# Patient Record
Sex: Female | Born: 1935 | Race: White | Hispanic: No | Marital: Married | Smoking: Former smoker
Health system: Southern US, Community
[De-identification: ages and names within clinical notes are randomized; demographics above are authoritative.]

## PROBLEM LIST (undated history)

## (undated) DIAGNOSIS — Z950 Presence of cardiac pacemaker: Secondary | ICD-10-CM

## (undated) DIAGNOSIS — M1990 Unspecified osteoarthritis, unspecified site: Secondary | ICD-10-CM

## (undated) DIAGNOSIS — C50919 Malignant neoplasm of unspecified site of unspecified female breast: Secondary | ICD-10-CM

## (undated) HISTORY — DX: Malignant neoplasm of unspecified site of unspecified female breast: C50.919

## (undated) HISTORY — DX: Presence of cardiac pacemaker: Z95.0

## (undated) HISTORY — PX: BREAST BIOPSY: SHX20

## (undated) HISTORY — PX: PACEMAKER IMPLANT: EP1218

## (undated) HISTORY — PX: COLONOSCOPY: SHX174

---

## 2005-08-10 ENCOUNTER — Ambulatory Visit: Payer: Self-pay

## 2005-08-12 ENCOUNTER — Ambulatory Visit: Payer: Self-pay

## 2006-01-25 ENCOUNTER — Emergency Department: Payer: Self-pay | Admitting: Emergency Medicine

## 2006-02-08 ENCOUNTER — Ambulatory Visit: Payer: Self-pay | Admitting: General Surgery

## 2006-08-09 ENCOUNTER — Ambulatory Visit: Payer: Self-pay | Admitting: General Surgery

## 2007-08-11 ENCOUNTER — Ambulatory Visit: Payer: Self-pay | Admitting: General Surgery

## 2008-02-07 ENCOUNTER — Ambulatory Visit: Payer: Self-pay | Admitting: Cardiology

## 2008-02-07 ENCOUNTER — Other Ambulatory Visit: Payer: Self-pay

## 2008-02-08 ENCOUNTER — Other Ambulatory Visit: Payer: Self-pay

## 2008-08-14 ENCOUNTER — Ambulatory Visit: Payer: Self-pay | Admitting: General Surgery

## 2009-08-19 ENCOUNTER — Ambulatory Visit: Payer: Self-pay | Admitting: General Surgery

## 2010-08-25 ENCOUNTER — Ambulatory Visit: Payer: Self-pay | Admitting: Family Medicine

## 2011-09-25 ENCOUNTER — Ambulatory Visit: Payer: Self-pay | Admitting: Family Medicine

## 2014-09-20 DIAGNOSIS — I442 Atrioventricular block, complete: Secondary | ICD-10-CM | POA: Insufficient documentation

## 2014-09-20 DIAGNOSIS — I10 Essential (primary) hypertension: Secondary | ICD-10-CM | POA: Insufficient documentation

## 2015-03-21 DIAGNOSIS — F411 Generalized anxiety disorder: Secondary | ICD-10-CM

## 2015-03-21 DIAGNOSIS — F418 Other specified anxiety disorders: Secondary | ICD-10-CM | POA: Insufficient documentation

## 2016-04-20 ENCOUNTER — Other Ambulatory Visit: Payer: Self-pay | Admitting: Family Medicine

## 2016-04-20 DIAGNOSIS — N6312 Unspecified lump in the right breast, upper inner quadrant: Secondary | ICD-10-CM

## 2016-04-28 ENCOUNTER — Ambulatory Visit
Admission: RE | Admit: 2016-04-28 | Discharge: 2016-04-28 | Disposition: A | Discharge status: Home / Self Care | Payer: Medicare Other | Source: Ambulatory Visit | Attending: Family Medicine | Admitting: Family Medicine

## 2016-04-28 DIAGNOSIS — N6312 Unspecified lump in the right breast, upper inner quadrant: Secondary | ICD-10-CM

## 2016-04-28 DIAGNOSIS — N63 Unspecified lump in breast: Secondary | ICD-10-CM | POA: Diagnosis not present

## 2016-04-29 ENCOUNTER — Other Ambulatory Visit: Payer: Self-pay | Admitting: Family Medicine

## 2016-04-29 DIAGNOSIS — N6310 Unspecified lump in the right breast, unspecified quadrant: Secondary | ICD-10-CM

## 2016-05-13 ENCOUNTER — Ambulatory Visit
Admission: RE | Admit: 2016-05-13 | Discharge: 2016-05-13 | Disposition: A | Discharge status: Home / Self Care | Payer: Medicare Other | Source: Ambulatory Visit | Attending: Family Medicine | Admitting: Family Medicine

## 2016-05-13 DIAGNOSIS — N6310 Unspecified lump in the right breast, unspecified quadrant: Secondary | ICD-10-CM

## 2016-05-13 DIAGNOSIS — N63 Unspecified lump in breast: Secondary | ICD-10-CM | POA: Insufficient documentation

## 2016-05-13 DIAGNOSIS — C50911 Malignant neoplasm of unspecified site of right female breast: Secondary | ICD-10-CM | POA: Insufficient documentation

## 2016-05-13 DIAGNOSIS — C50919 Malignant neoplasm of unspecified site of unspecified female breast: Secondary | ICD-10-CM

## 2016-05-13 HISTORY — DX: Malignant neoplasm of unspecified site of unspecified female breast: C50.919

## 2016-05-13 HISTORY — PX: BREAST BIOPSY: SHX20

## 2016-05-20 ENCOUNTER — Encounter: Payer: Self-pay | Admitting: *Deleted

## 2016-05-20 NOTE — Progress Notes (Signed)
  Oncology Nurse Navigator Documentation  Navigator Location: [facility] (05/20/16 1300) Navigator Encounter Type: Introductory phone call (05/20/16 1300)   Abnormal Finding Date: 04/28/16 (05/20/16 1300) Confirmed Diagnosis Date: 05/18/16 (05/20/16 1300)         Barriers/Navigation Needs: Coordination of Care (05/20/16 1300)   Interventions: Coordination of Care (05/20/16 1300)                      Time Spent with Patient: 45 (05/20/16 1300)   Called patient today to establish navigation services and schedule appointment with medical oncology.  Patient informed of her appointment with Dr. Grayland Ormond on 05/26/16 @ 10:00.  Patient does not want any educational literature until her appointment.  Informed Dr. Reuel Boom office of her appointment.  She is to call if she has any questions or needs.

## 2016-05-26 ENCOUNTER — Encounter: Payer: Self-pay | Admitting: Oncology

## 2016-05-26 ENCOUNTER — Encounter: Payer: Self-pay | Admitting: *Deleted

## 2016-05-26 ENCOUNTER — Inpatient Hospital Stay: Payer: Medicare Other | Attending: Oncology | Admitting: Oncology

## 2016-05-26 VITALS — BP 152/77 | HR 69 | Temp 97.1°F | Resp 18 | Ht 60.24 in | Wt 130.1 lb

## 2016-05-26 DIAGNOSIS — Z79899 Other long term (current) drug therapy: Secondary | ICD-10-CM

## 2016-05-26 DIAGNOSIS — C50211 Malignant neoplasm of upper-inner quadrant of right female breast: Secondary | ICD-10-CM | POA: Diagnosis present

## 2016-05-26 DIAGNOSIS — Z170 Estrogen receptor positive status [ER+]: Secondary | ICD-10-CM

## 2016-05-26 DIAGNOSIS — C50911 Malignant neoplasm of unspecified site of right female breast: Secondary | ICD-10-CM

## 2016-05-26 DIAGNOSIS — Z79811 Long term (current) use of aromatase inhibitors: Secondary | ICD-10-CM | POA: Diagnosis not present

## 2016-05-26 LAB — SURGICAL PATHOLOGY

## 2016-05-26 MED ORDER — LETROZOLE 2.5 MG PO TABS: 2.5000 mg | ORAL_TABLET | Freq: Every day | ORAL | Status: DC

## 2016-05-26 NOTE — Progress Notes (Signed)
  Oncology Nurse Navigator Documentation  Navigator Location: [facility] (05/26/16 1100) Navigator Encounter Type: Initial MedOnc (05/26/16 1100)         Treatment Initiated Date: 05/26/16 (05/26/16 1100) Patient Visit Type: MedOnc (05/26/16 1100) Treatment Phase: Pre-Tx/Tx Discussion (05/26/16 1100) Barriers/Navigation Needs: Education (05/26/16 1100) Education: Newly Diagnosed Cancer Education;Coping with Diagnosis/ Prognosis (05/26/16 1100) Interventions: Education Method (05/26/16 1100)     Education Method: Written;Verbal (05/26/16 1100)      Acuity: Level 2 (05/26/16 1100)         Time Spent with Patient: 90 (05/26/16 1100)   Met patient and her sons today during her initial medical oncology visit with Dr. Grayland Ormond.  Patient is newly diagnosed with ER/PR positive, Her2 2+ breast cancer.  Dr. Grayland Ormond discussed options of treatment and patient wishes to start antihormonal therapy as neoadjuvant treatment.  Gave patient breast cancer educational literature, "My Breast Cancer Treatment Handbook" by Josephine Igo, RN.  She is to call if she has any questions or needs.

## 2016-05-26 NOTE — Progress Notes (Signed)
Offers no complaints  

## 2016-05-27 DIAGNOSIS — C50911 Malignant neoplasm of unspecified site of right female breast: Secondary | ICD-10-CM | POA: Insufficient documentation

## 2016-05-28 ENCOUNTER — Encounter: Payer: Self-pay | Admitting: Anatomic Pathology & Clinical Pathology

## 2016-06-02 DIAGNOSIS — C50211 Malignant neoplasm of upper-inner quadrant of right female breast: Secondary | ICD-10-CM | POA: Insufficient documentation

## 2016-06-02 NOTE — Progress Notes (Signed)
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   INTERVAL HISTORY: Patient is a 82 year old female who was noted to have a palpable right breast mass on examination. Subsequent mammogram and biopsy confirm the above stated breast cancer. Currently, she feels well and is at her baseline. She has no neurologic complaints. She denies any pain. She has good appetite and denies weight loss. She has no recent fevers or illnesses. She denies any chest pain or shortness of breath. She has no nausea, vomiting, constipation, or diarrhea. She has no urinary complaints. Patient feels at her baseline and offers no further specific complaints today.  REVIEW OF SYSTEMS:   Review of Systems  Constitutional: Negative.  Negative for fever, malaise/fatigue and weight loss.  Respiratory: Negative.  Negative for cough.   Cardiovascular: Negative.  Negative for chest pain.  Gastrointestinal: Negative.  Negative for abdominal pain.  Genitourinary: Negative.   Musculoskeletal: Negative.   Neurological: Negative.  Negative for weakness.  Psychiatric/Behavioral: Negative.     As per HPI. Otherwise, a complete review of systems is negatve.  PAST MEDICAL HISTORY: Past Medical History:  Diagnosis Date  . Pacemaker     PAST SURGICAL HISTORY: Past Surgical History:  Procedure Laterality Date  . BREAST BIOPSY Right    neg  . BREAST BIOPSY Right 05/13/2016   path pending    FAMILY HISTORY: Family History  Problem Relation Age of Onset  . Breast cancer Neg Hx        ADVANCED DIRECTIVES:    HEALTH MAINTENANCE: Social History  Substance Use Topics  . Smoking status: Never Smoker  . Smokeless tobacco: Not on file  .  Alcohol use Not on file     Colonoscopy:  PAP:  Bone density:  Lipid panel:  Allergies no known allergies  Current Outpatient Prescriptions  Medication Sig Dispense Refill  . ALPRAZolam (XANAX) 0.25 MG tablet Take 1 tablet by mouth at bedtime.    Marland Kitchen aspirin EC 81 MG tablet Take 1 tablet by mouth daily.    . B Complex Vitamins (VITAMIN B COMPLEX PO) Take 1 tablet by mouth daily.    . Cholecalciferol (VITAMIN D3) 2000 units capsule Take 1 capsule by mouth daily.    . Coenzyme Q10 (COQ-10 PO) Take 1 tablet by mouth daily.    . Magnesium 250 MG TABS Take 1 tablet by mouth daily.    . Omega-3 Fatty Acids (FISH OIL) 1200 MG CAPS Take 1 tablet by mouth 2 (two) times daily.    Marland Kitchen letrozole (FEMARA) 2.5 MG tablet Take 1 tablet (2.5 mg total) by mouth daily. 30 tablet 3   No current facility-administered medications for this visit.     OBJECTIVE: Vitals:   05/26/16 1013  BP: (!) 152/77  Pulse: 69  Resp: 18  Temp: 97.1 F (36.2 C)     Body mass index is 25.2 kg/m.    ECOG FS:0 - Asymptomatic  General: Well-developed, well-nourished, no acute distress. Eyes: Pink conjunctiva, anicteric sclera. HEENT: Normocephalic, moist mucous membranes, clear oropharnyx. Breasts: Easily palpable 2 cm right breast mass. Lungs: Clear to auscultation bilaterally. Heart: Regular rate and rhythm. No rubs, murmurs, or gallops. Abdomen: Soft, nontender, nondistended. No organomegaly noted, normoactive bowel sounds. Musculoskeletal: No edema, cyanosis,  or clubbing. Neuro: Alert, answering all questions appropriately. Cranial nerves grossly intact. Skin: No rashes or petechiae noted. Psych: Normal affect. Lymphatics: No cervical, calvicular, axillary or inguinal LAD.   LAB RESULTS:  No results found for: NA, K, CL, CO2, GLUCOSE, BUN, CREATININE, CALCIUM, PROT, ALBUMIN, AST, ALT, ALKPHOS, BILITOT, GFRNONAA, GFRAA  No results found for: WBC, NEUTROABS, HGB, HCT, MCV, PLT   STUDIES: Mm Clip Placement  Right  Result Date: 05/13/2016 CLINICAL DATA:  Status post ultrasound-guided right breast biopsy EXAM: DIAGNOSTIC RIGHT MAMMOGRAM POST ULTRASOUND BIOPSY COMPARISON:  Previous exam(s). FINDINGS: Mammographic images were obtained following ultrasound guided biopsy of a suspicious right breast mass at 2 o'clock. Post biopsy mammogram demonstrates the ribbon shaped biopsy marker in the expected location within the upper, inner right breast. IMPRESSION: Appropriate marker position as above. Final Assessment: Post Procedure Mammograms for Marker Placement Electronically Signed   By: Pamelia Hoit M.D.   On: 05/13/2016 14:00   [place] Rt Breast Bx W Loc Dev 1st Lesion Img Bx Spec US Guide  Addendum Date: 05/21/2016   ADDENDUM REPORT: 05/21/2016 12:51 ADDENDUM: Pathology of the right breast biopsy revealed BREAST, RIGHT, 2 O'CLOCK, 7 CM FROM NIPPLE; ULTRASOUND-GUIDED CORE BIOPSY: INVASIVE MAMMARY CARCINOMA OF NO SPECIAL TYPE. Size of invasive carcinoma: 1.3 cm in this sample. Histologic grade of invasive carcinoma: Grade 2. Ductal carcinoma in situ: Not identified. Lymphovascular invasion: Not identified. Comment: The tumor exhibits diffusely infiltrative features. Immunohistochemistry for E-cadherin was performed for further classification, and the neoplastic cells are diffusely and strongly positive for E-cadherin, which supports the above diagnosis and is evidence against a lobular carcinoma variant. See below for ER, PR, and HER2 IHC results. HER2 FISH has been ordered for 2+ HER IHC and the result will be reported in an addendum. The diagnosis was called Deneice Breedlove in the [facility] on 05/18/16 at 4:20 PM. Read-back was performed. This was found to be concordant with Dr. Raul Del impression and notes. Recommendations: Oncology and surgical referral as clinically appropriate. The patient was contacted by phone on 05/15/16 by Jetta Lout, [facility]) for a post biopsy check. The patient  stated she had done well with no bleeding. She does have a bruise. Post biopsy instructions were reviewed with the patient. All of her questions were answered. She was informed that the results were delayed for further testing and would not be available before 05/18/16. Dr. Reuel Boom nurse was also notified of the pathology delay. At the request of the patient, results and recommendations were relayed to the patient by phone by Dr. Jimmye Norman on 05/19/16. An appointment was made for the patient to see Dr. Grayland Ormond in oncology on 05/26/16 at 10:00 AM. Surgical referral will be made by Dr. Grayland Ormond if appropriate after her consultation on 05/26/16. The patient and her son, Jermisha Hoffart were both notified of this appointment. Addendum by Jetta Lout, RRA on 05/21/16. Electronically Signed   By: Pamelia Hoit M.D.   On: 05/21/2016 12:51  Result Date: 05/21/2016 CLINICAL DATA:  82 year old female with a suspicious right breast mass at 2 o'clock EXAM: ULTRASOUND GUIDED RIGHT BREAST CORE NEEDLE BIOPSY COMPARISON:  Previous exam(s). FINDINGS: I met with the patient and we discussed the procedure of ultrasound-guided biopsy, including benefits and alternatives. We discussed the high likelihood of a successful procedure. We discussed the risks of the procedure, including infection, bleeding, tissue injury, clip migration, and inadequate sampling. Informed written consent was given. The usual time-out protocol was performed immediately prior to the  procedure. Using sterile technique and 1% Lidocaine as local anesthetic, under direct ultrasound visualization, a 12 gauge spring-loaded device was used to perform biopsy of a suspicious right breast mass at 2 o'clock, 7 cm from the nipple using a lateral to medial approach. At the conclusion of the procedure a ribbon shaped tissue marker clip was deployed into the biopsy cavity. Follow up 2 view mammogram was performed and dictated separately. IMPRESSION: Ultrasound guided biopsy of a  suspicious right breast mass at 2 o'clock. No apparent complications. Electronically Signed: By: Pamelia Hoit M.D. 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   PLAN:    1.  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 This may function as neoadjuvant treatment as patient said she might reconsider surgery in the future. Return to clinic in 3 months for further evaluation. 2. Post menopausal: Will get baseline bone mineral density in the next 1-2 weeks since patient is starting letrozole.  Approximately 45 minutes was spent in discussion of which greater than 50% was consultation.  Patient expressed understanding and was in agreement with this plan. She also understands that She can call clinic at any time with any questions, concerns, or complaints.   Primary malignant neoplasm of upper inner quadrant of right female breast [facility])   Staging form: Breast, AJCC 7th Edition   - Clinical stage from 06/02/2016: Stage IIA (T2, N0, M0) - Signed by Lloyd Huger, MD on 06/02/2016  Lloyd Huger, MD   06/02/2016 10:49 PM

## 2016-06-17 ENCOUNTER — Ambulatory Visit
Admission: RE | Admit: 2016-06-17 | Discharge: 2016-06-17 | Disposition: A | Discharge status: Home / Self Care | Payer: Medicare Other | Source: Ambulatory Visit | Attending: Oncology | Admitting: Oncology

## 2016-06-17 DIAGNOSIS — Z780 Asymptomatic menopausal state: Secondary | ICD-10-CM | POA: Diagnosis not present

## 2016-06-17 DIAGNOSIS — C50911 Malignant neoplasm of unspecified site of right female breast: Secondary | ICD-10-CM | POA: Diagnosis not present

## 2016-06-17 DIAGNOSIS — M810 Age-related osteoporosis without current pathological fracture: Secondary | ICD-10-CM | POA: Diagnosis not present

## 2016-06-21 ENCOUNTER — Other Ambulatory Visit: Payer: Self-pay | Admitting: Oncology

## 2016-06-21 MED ORDER — ALENDRONATE SODIUM 70 MG PO TABS: 70.0000 mg | ORAL_TABLET | ORAL | 3 refills | Status: DC

## 2016-08-26 ENCOUNTER — Inpatient Hospital Stay: Payer: Medicare Other | Attending: Oncology | Admitting: Oncology

## 2016-08-26 VITALS — BP 183/81 | HR 97 | Temp 97.9°F | Resp 18 | Wt 128.5 lb

## 2016-08-26 DIAGNOSIS — I10 Essential (primary) hypertension: Secondary | ICD-10-CM | POA: Insufficient documentation

## 2016-08-26 DIAGNOSIS — Z7982 Long term (current) use of aspirin: Secondary | ICD-10-CM

## 2016-08-26 DIAGNOSIS — Z79811 Long term (current) use of aromatase inhibitors: Secondary | ICD-10-CM

## 2016-08-26 DIAGNOSIS — Z170 Estrogen receptor positive status [ER+]: Secondary | ICD-10-CM | POA: Diagnosis not present

## 2016-08-26 DIAGNOSIS — C50211 Malignant neoplasm of upper-inner quadrant of right female breast: Secondary | ICD-10-CM | POA: Insufficient documentation

## 2016-08-26 DIAGNOSIS — M818 Other osteoporosis without current pathological fracture: Secondary | ICD-10-CM | POA: Diagnosis not present

## 2016-08-26 DIAGNOSIS — Z79899 Other long term (current) drug therapy: Secondary | ICD-10-CM | POA: Diagnosis not present

## 2016-08-26 NOTE — Progress Notes (Signed)
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   INTERVAL HISTORY: Patient returns to clinic for routine 3 month evaluation. She is near complete resolution of her right breast mass. She is tolerating letrozole well without significant side effects. Currently, she feels well and is at her baseline. She has no neurologic complaints. She denies any pain. She has a good appetite and denies weight loss. She has no recent fevers or illnesses. She denies any chest pain or shortness of breath. She has no nausea, vomiting, constipation, or diarrhea. She has no urinary complaints. Patient feels at her baseline and offers no specific complaints today.  REVIEW OF SYSTEMS:   Review of Systems  Constitutional: Negative.  Negative for fever, malaise/fatigue and weight loss.  Respiratory: Negative.  Negative for cough.   Cardiovascular: Negative.  Negative for chest pain.  Gastrointestinal: Negative.  Negative for abdominal pain.  Genitourinary: Negative.   Musculoskeletal: Negative.   Neurological: Negative.  Negative for weakness.  Psychiatric/Behavioral: Negative.     As per HPI. Otherwise, a complete review of systems is negative.  PAST MEDICAL HISTORY: Past Medical History:  Diagnosis Date  . Pacemaker     PAST SURGICAL HISTORY: Past Surgical History:  Procedure Laterality Date  . BREAST BIOPSY Right    neg  . BREAST BIOPSY Right 05/13/2016   path pending    FAMILY HISTORY: Family History  Problem Relation Age of Onset  . Breast cancer Neg Hx        ADVANCED DIRECTIVES:    HEALTH MAINTENANCE: Social History  Substance Use Topics  . Smoking status: Never Smoker  . Smokeless tobacco: Not on  file  . Alcohol use Not on file     Colonoscopy:  PAP:  Bone density:  Lipid panel:  Allergies no known allergies  Current Outpatient Prescriptions  Medication Sig Dispense Refill  . alendronate (FOSAMAX) 70 MG tablet Take 1 tablet (70 mg total) by mouth once a week. Take with a full glass of water on an empty stomach. 12 tablet 3  . ALPRAZolam (XANAX) 0.25 MG tablet Take 1 tablet by mouth at bedtime.    Marland Kitchen aspirin EC 81 MG tablet Take 1 tablet by mouth daily.    . B Complex Vitamins (VITAMIN B COMPLEX PO) Take 1 tablet by mouth daily.    . Cholecalciferol (VITAMIN D3) 2000 units capsule Take 1 capsule by mouth daily.    . Coenzyme Q10 (COQ-10 PO) Take 1 tablet by mouth daily.    Marland Kitchen letrozole (FEMARA) 2.5 MG tablet Take 1 tablet (2.5 mg total) by mouth daily. 30 tablet 3  . Magnesium 250 MG TABS Take 1 tablet by mouth daily.    . Omega-3 Fatty Acids (FISH OIL) 1200 MG CAPS Take 1 tablet by mouth 2 (two) times daily.     No current facility-administered medications for this visit.     OBJECTIVE: Vitals:   08/26/16 1100  BP: (!) 183/81  Pulse: 97  Resp: 18  Temp: 97.9 F (36.6 C)     Body mass index is 24.91 kg/m.    ECOG FS:0 - Asymptomatic  General: Well-developed, well-nourished, no acute distress. Eyes: Pink conjunctiva, anicteric sclera. Breasts: Right breast mass no longer palpable.  Lungs: Clear to  auscultation bilaterally. Heart: Regular rate and rhythm. No rubs, murmurs, or gallops. Abdomen: Soft, nontender, nondistended. No organomegaly noted, normoactive bowel sounds. Musculoskeletal: No edema, cyanosis, or clubbing. Neuro: Alert, answering all questions appropriately. Cranial nerves grossly intact. Skin: No rashes or petechiae noted. Psych: Normal affect.   LAB RESULTS:  No results found for: NA, K, CL, CO2, GLUCOSE, BUN, CREATININE, CALCIUM, PROT, ALBUMIN, AST, ALT, ALKPHOS, BILITOT, GFRNONAA, GFRAA  No results found for: WBC, NEUTROABS, HGB, HCT, MCV,  PLT   STUDIES: No results found.  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   PLAN:    1.  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 Patient initiated letrozole in July 2017 with near resolution of her breast mass. Return to clinic in 4 months for further evaluation. 2. Osteoporosis: Baseline bone mineral density on June 17, 2016 reported a T score of -2.9. Continue Fosamax, calcium and vitamin D. Repeat in August 2018.  3. Hypertension: Patient's blood pressure is significantly elevated today. Continue monitoring and treatment per primary care.  Patient expressed understanding and was in agreement with this plan. She also understands that She can call clinic at any time with any questions, concerns, or complaints.   Primary malignant neoplasm of upper inner quadrant of right female breast [facility])   Staging form: Breast, AJCC 7th Edition   - Clinical stage from 06/02/2016: Stage IIA (T2, N0, M0) - Signed by Lloyd Huger, MD on 06/02/2016  Lloyd Huger, MD   08/29/2016 8:32 AM

## 2016-08-26 NOTE — Progress Notes (Signed)
States is having left hip pain that started recently.

## 2016-09-07 ENCOUNTER — Other Ambulatory Visit: Payer: Self-pay | Admitting: *Deleted

## 2016-09-07 MED ORDER — LETROZOLE 2.5 MG PO TABS: 2.5000 mg | ORAL_TABLET | Freq: Every day | ORAL | 3 refills | Status: DC

## 2016-12-11 ENCOUNTER — Other Ambulatory Visit: Payer: Self-pay | Admitting: *Deleted

## 2016-12-11 MED ORDER — LETROZOLE 2.5 MG PO TABS: 2.5000 mg | ORAL_TABLET | Freq: Every day | ORAL | 1 refills | Status: DC

## 2016-12-27 NOTE — Progress Notes (Signed)
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   INTERVAL HISTORY: Patient returns to clinic for routine evaluation. She is tolerating letrozole well without significant side effects. Currently, she feels well and is at her baseline. She has no neurologic complaints. She denies any pain. She has a good appetite and denies weight loss. She has no recent fevers or illnesses. She denies any chest pain or shortness of breath. She has no nausea, vomiting, constipation, or diarrhea. She has no urinary complaints. Patient feels at her baseline and offers no specific complaints today.  REVIEW OF SYSTEMS:   Review of Systems  Constitutional: Negative.  Negative for fever, malaise/fatigue and weight loss.  Respiratory: Negative.  Negative for cough and shortness of breath.   Cardiovascular: Negative.  Negative for chest pain and leg swelling.  Gastrointestinal: Negative.  Negative for abdominal pain.  Genitourinary: Negative.   Musculoskeletal: Negative.   Neurological: Negative.  Negative for sensory change and weakness.  Psychiatric/Behavioral: Negative.  The patient is not nervous/anxious.     As per HPI. Otherwise, a complete review of systems is negative.  PAST MEDICAL HISTORY: Past Medical History:  Diagnosis Date  . Breast cancer ([place])   . Pacemaker     PAST SURGICAL HISTORY: Past Surgical History:  Procedure Laterality Date  . BREAST BIOPSY Right    neg  . BREAST BIOPSY Right 05/13/2016   path pending    FAMILY HISTORY: Family History  Problem Relation Age of Onset  . Breast cancer Neg Hx        ADVANCED DIRECTIVES:    HEALTH MAINTENANCE: Social History  Substance Use Topics  .  Smoking status: Never Smoker  . Smokeless tobacco: Never Used  . Alcohol use No     Colonoscopy:  PAP:  Bone density:  Lipid panel:  No Known Allergies  Current Outpatient Prescriptions  Medication Sig Dispense Refill  . ALPRAZolam (XANAX) 0.25 MG tablet Take 1 tablet by mouth at bedtime.    Marland Kitchen aspirin EC 81 MG tablet Take 1 tablet by mouth daily.    . B Complex Vitamins (VITAMIN B COMPLEX PO) Take 1 tablet by mouth daily.    . Calcium Carbonate-Vitamin D (CALTRATE 600+D) 600-400 MG-UNIT tablet Take 1 tablet by mouth 2 (two) times daily.    . Cholecalciferol (VITAMIN D3) 2000 units capsule Take 1 capsule by mouth daily.    . Coenzyme Q10 (COQ-10 PO) Take 1 tablet by mouth daily.    Marland Kitchen letrozole (FEMARA) 2.5 MG tablet Take 1 tablet (2.5 mg total) by mouth daily. 90 tablet 1  . Omega-3 Fatty Acids (FISH OIL) 1200 MG CAPS Take 1 tablet by mouth 2 (two) times daily.     No current facility-administered medications for this visit.     OBJECTIVE: Vitals:   12/28/16 1152  BP: (!) 151/59  Pulse: 67  Resp: 18  Temp: 98.3 F (36.8 C)     Body mass index is 24.39 kg/m.    ECOG FS:0 - Asymptomatic  General: Well-developed, well-nourished, no acute distress. Eyes: Pink conjunctiva, anicteric sclera. Breasts: Right breast mass no longer palpable.  Lungs: Clear to auscultation bilaterally. Heart: Regular rate and rhythm. No rubs, murmurs, or gallops. Abdomen: Soft, nontender, nondistended. No organomegaly noted,  normoactive bowel sounds. Musculoskeletal: No edema, cyanosis, or clubbing. Neuro: Alert, answering all questions appropriately. Cranial nerves grossly intact. Skin: No rashes or petechiae noted. Psych: Normal affect.   LAB RESULTS:  No results found for: NA, K, CL, CO2, GLUCOSE, BUN, CREATININE, CALCIUM, PROT, ALBUMIN, AST, ALT, ALKPHOS, BILITOT, GFRNONAA, GFRAA  No results found for: WBC, NEUTROABS, HGB, HCT, MCV, PLT   STUDIES: No results found.  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   PLAN:    1.  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 Patient initiated letrozole in July 2017 with near complete resolution of her breast mass. Patient will require repeat mammogram in approximately 6 months. Return to clinic 1-2 days later for further evaluation.  2. Osteoporosis: Baseline bone mineral density on June 17, 2016 reported a T score of -2.9. Continue Fosamax, calcium and vitamin D. Repeat in August 2018.  3. Hypertension: Patient's blood pressure is elevated today. Continue monitoring and treatment per primary care.  Patient expressed understanding and was in agreement with this plan. She also understands that She can call clinic at any time with any questions, concerns, or complaints.   Cancer Staging Primary malignant neoplasm of upper inner quadrant of right female breast [facility]) Staging form: Breast, AJCC 7th Edition - Clinical stage from 06/02/2016: Stage IIA (T2, N0, M0) - Signed by Lloyd Huger, MD on 06/02/2016   Lloyd Huger, MD   12/29/2016 4:37 PM

## 2016-12-28 ENCOUNTER — Inpatient Hospital Stay: Payer: Medicare Other | Attending: Oncology | Admitting: Oncology

## 2016-12-28 ENCOUNTER — Encounter: Payer: Self-pay | Admitting: Oncology

## 2016-12-28 ENCOUNTER — Inpatient Hospital Stay: Payer: Medicare Other

## 2016-12-28 VITALS — BP 151/59 | HR 67 | Temp 98.3°F | Resp 18 | Ht 60.24 in | Wt 125.9 lb

## 2016-12-28 DIAGNOSIS — C50211 Malignant neoplasm of upper-inner quadrant of right female breast: Secondary | ICD-10-CM | POA: Diagnosis present

## 2016-12-28 DIAGNOSIS — Z170 Estrogen receptor positive status [ER+]: Secondary | ICD-10-CM | POA: Diagnosis not present

## 2016-12-28 DIAGNOSIS — M818 Other osteoporosis without current pathological fracture: Secondary | ICD-10-CM | POA: Diagnosis not present

## 2016-12-28 DIAGNOSIS — Z79899 Other long term (current) drug therapy: Secondary | ICD-10-CM | POA: Diagnosis not present

## 2016-12-28 DIAGNOSIS — I10 Essential (primary) hypertension: Secondary | ICD-10-CM | POA: Diagnosis not present

## 2016-12-28 DIAGNOSIS — Z7982 Long term (current) use of aspirin: Secondary | ICD-10-CM | POA: Diagnosis not present

## 2016-12-28 DIAGNOSIS — Z79811 Long term (current) use of aromatase inhibitors: Secondary | ICD-10-CM | POA: Insufficient documentation

## 2016-12-28 DIAGNOSIS — Z780 Asymptomatic menopausal state: Secondary | ICD-10-CM

## 2016-12-28 NOTE — Progress Notes (Signed)
Pt notes no problems with breast cancer

## 2017-02-18 IMAGING — MG US  BREAST BX W/ LOC DEV 1ST LESION IMG BX SPEC US GUIDE*R*
1 series · 8 of 8 positions shown · non-contrast
Comparison: Previous exam(s).

ADDENDUM:
Pathology of the right breast biopsy revealed BREAST, RIGHT, 2
O'CLOCK, 7 CM FROM NIPPLE; ULTRASOUND-GUIDED CORE BIOPSY: INVASIVE
MAMMARY CARCINOMA OF NO SPECIAL TYPE. Size of invasive carcinoma:
1.3 cm in this sample. Histologic grade of invasive carcinoma: Grade
2. Ductal carcinoma in situ: Not identified. Lymphovascular
invasion: Not identified.

Comment: The tumor exhibits diffusely infiltrative features.
Immunohistochemistry for E-cadherin was performed for further
classification, and the neoplastic cells are diffusely and strongly
positive for E-cadherin, which supports the above diagnosis and is
evidence against a lobular carcinoma variant. See below for ER, PR,
and HER2 IHC results. HER2 FISH has been ordered for 2+ HER IHC and
the result will be reported in an addendum. The diagnosis was called
[facility]ulgata Menoe in the [facility] on 05/18/16 at
[DATE]. Read-back was performed.
This was found to be concordant with Dr. Chito impression and
notes.
Recommendations: Oncology and surgical referral as clinically
appropriate.
The patient was contacted by phone on 05/15/16 by Janel, Gelder
([REDACTED]) for a post biopsy check. The patient stated
she had done well with no bleeding. She does have a bruise. Post
biopsy instructions were reviewed with the patient. All of her
questions were answered. She was informed that the results were
delayed for further testing and would not be available before
05/18/16. Dr. Tinho nurse was also notified of the pathology
delay.
At the request of the patient, results and recommendations were
relayed to the patient by phone by Dr. Laurence on 05/19/16. An
appointment was made for the patient to see Dr. Raptorbad in oncology
on 05/26/16 at [DATE]. Surgical referral will be made by Dr.
Raptorbad if appropriate after her consultation on 05/26/16. The
patient and her Khon, Md Sawon were both notified of this
appointment.
Addendum by Janel, Gelder on 05/21/16.
CLINICAL DATA: [AGE] female with a suspicious right breast
mass at 2 o'clock
EXAM:
ULTRASOUND GUIDED RIGHT BREAST CORE NEEDLE BIOPSY

[Series 1: MG view · 0.06mm/px · 8 of 10 slices shown]
[im 1/10]
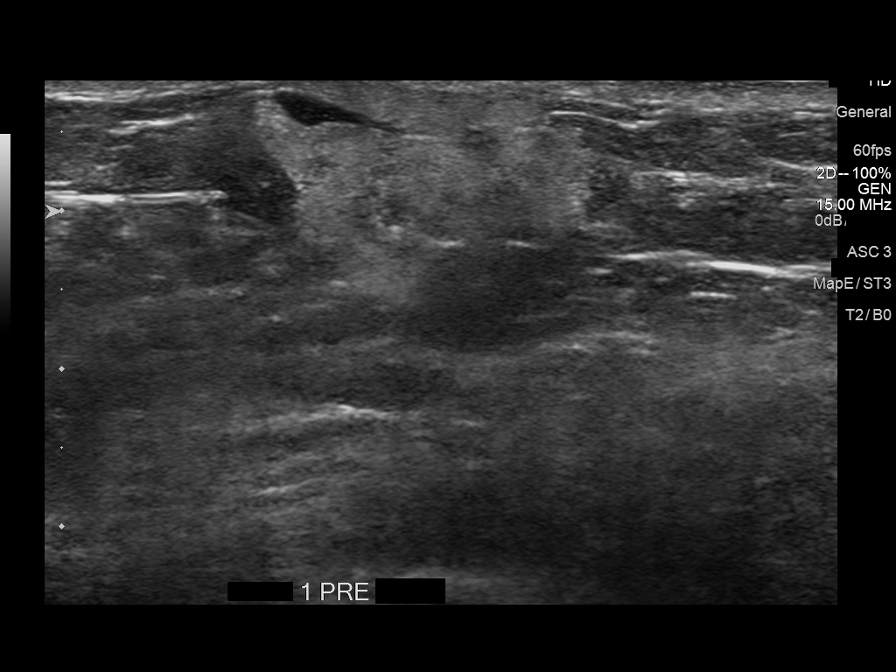
[im 2/10]
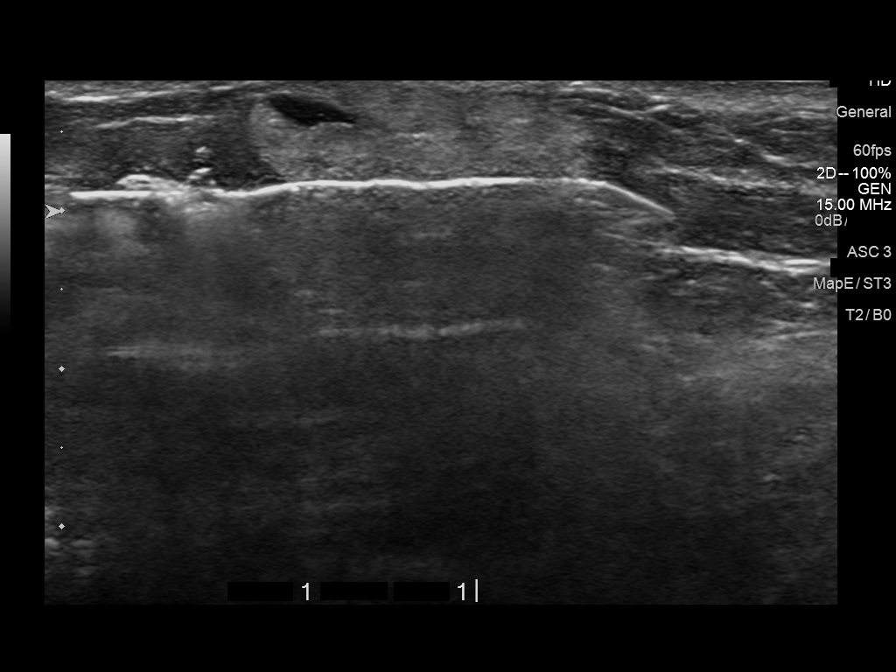
[im 3/10]
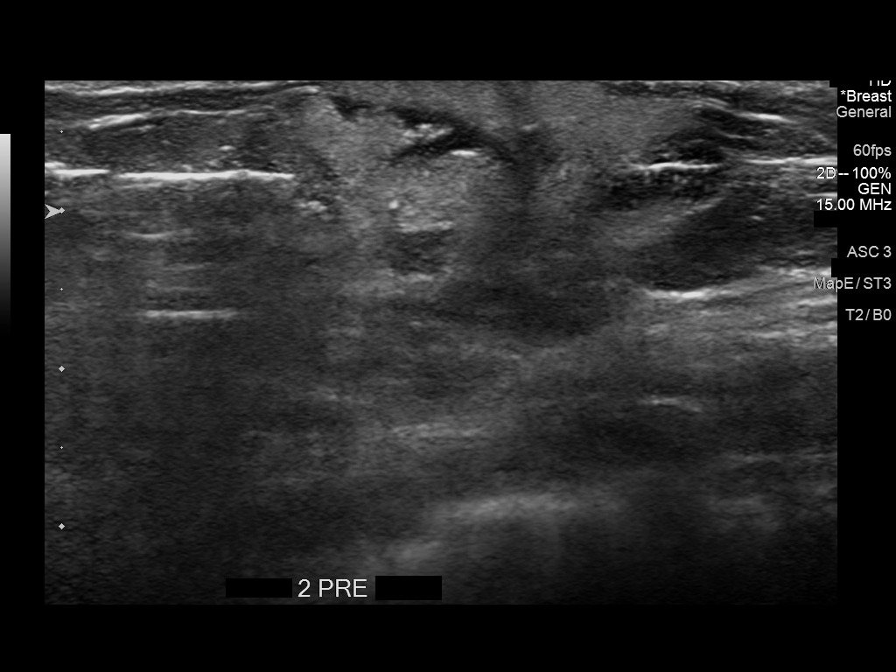
[im 4/10]
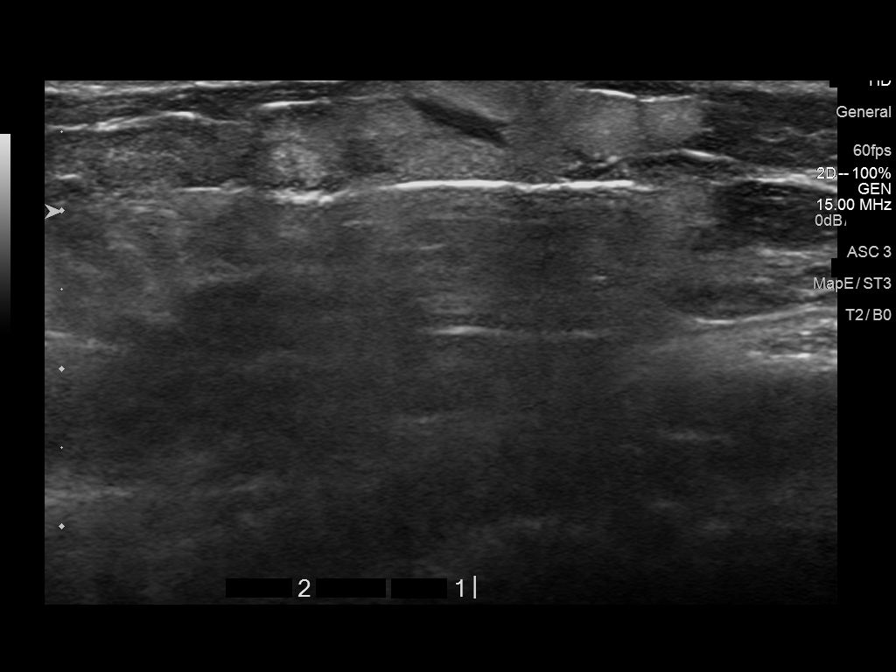
[im 6/10]
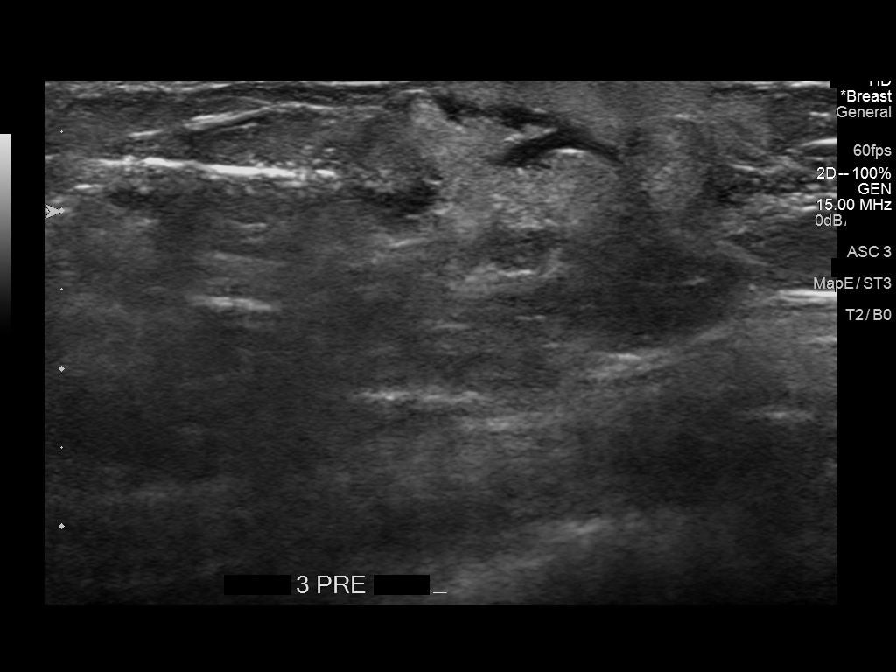
[im 7/10]
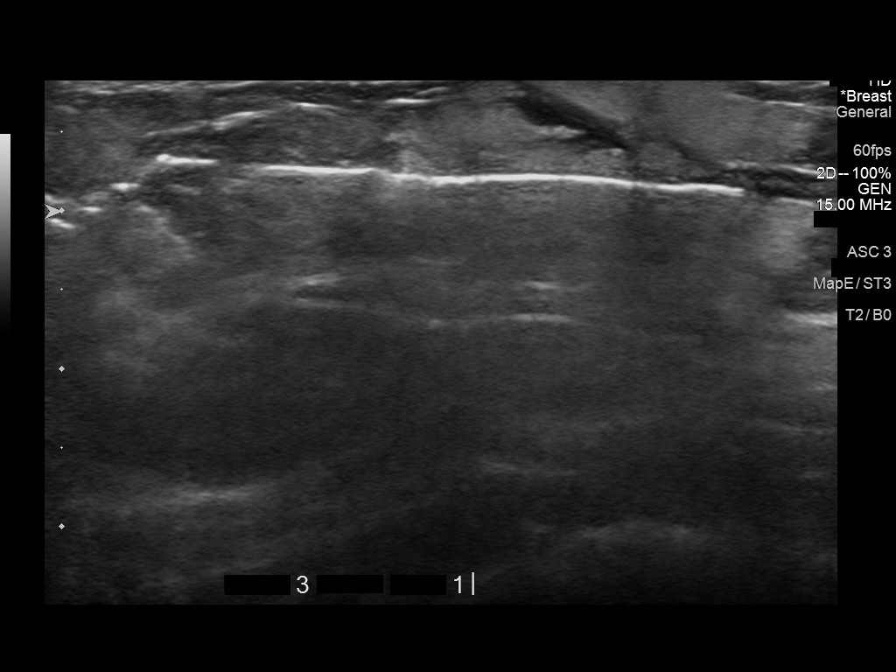
[im 8/10]
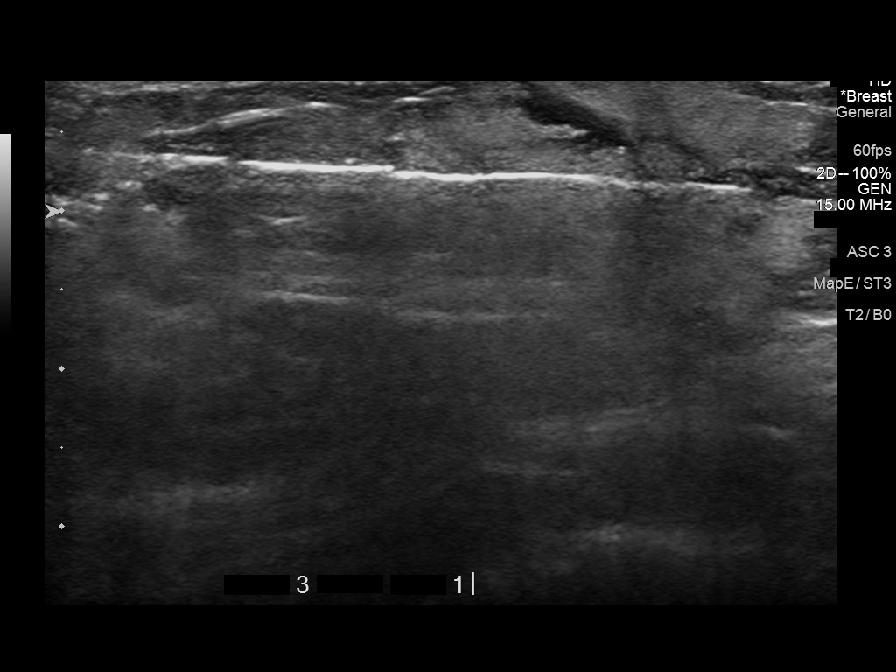
[im 10/10]
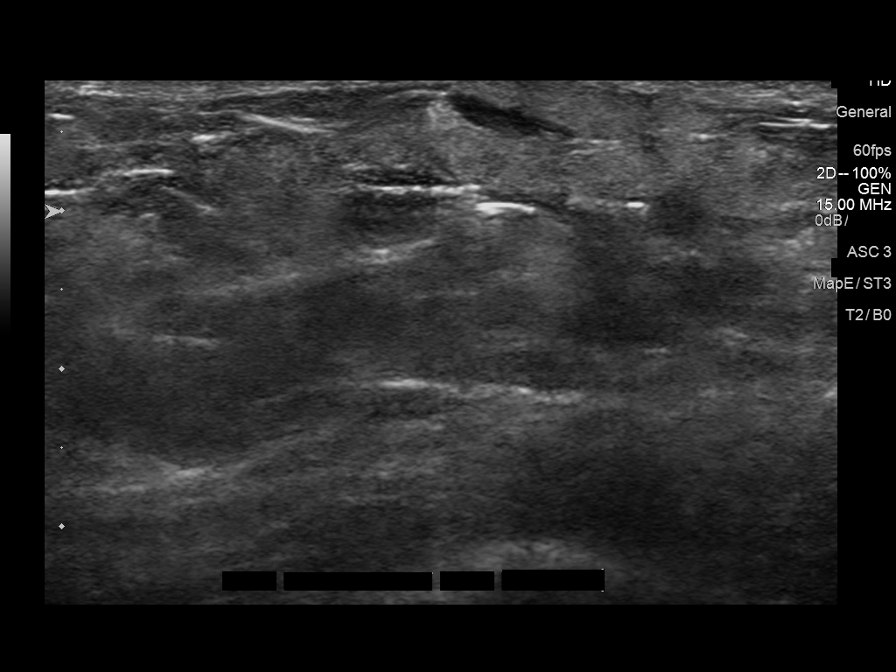

[8 of 8 positions shown; findings below may reference images not displayed]



Using sterile technique and 1% Lidocaine as local anesthetic, under
direct ultrasound visualization, a 12 gauge Brun device was
used to perform biopsy of a suspicious right breast mass at 2
o'clock, 7 cm from the nipple using a lateral to medial approach. At
the conclusion of the procedure a ribbon shaped tissue marker clip
was deployed into the biopsy cavity. Follow up 2 view mammogram was
performed and dictated separately.
IMPRESSION: Ultrasound guided biopsy of a suspicious right breast mass at 2
o'clock. No apparent complications.

## 2017-03-18 DIAGNOSIS — M810 Age-related osteoporosis without current pathological fracture: Secondary | ICD-10-CM | POA: Insufficient documentation

## 2017-06-07 ENCOUNTER — Telehealth: Payer: Self-pay | Admitting: *Deleted

## 2017-06-07 MED ORDER — LETROZOLE 2.5 MG PO TABS: 2.5000 mg | ORAL_TABLET | Freq: Every day | ORAL | 1 refills | Status: DC

## 2017-06-07 NOTE — Telephone Encounter (Signed)
Letrozole refilled

## 2017-06-22 ENCOUNTER — Ambulatory Visit
Admission: RE | Admit: 2017-06-22 | Discharge: 2017-06-22 | Disposition: A | Discharge status: Home / Self Care | Payer: Medicare Other | Source: Ambulatory Visit | Attending: Oncology | Admitting: Oncology

## 2017-06-22 ENCOUNTER — Other Ambulatory Visit: Payer: Self-pay | Admitting: Oncology

## 2017-06-22 DIAGNOSIS — C50211 Malignant neoplasm of upper-inner quadrant of right female breast: Secondary | ICD-10-CM

## 2017-06-22 DIAGNOSIS — Z780 Asymptomatic menopausal state: Secondary | ICD-10-CM | POA: Diagnosis present

## 2017-06-22 DIAGNOSIS — M810 Age-related osteoporosis without current pathological fracture: Secondary | ICD-10-CM | POA: Insufficient documentation

## 2017-06-23 NOTE — Progress Notes (Signed)
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   INTERVAL HISTORY: Patient returns to clinic for routine evaluation. She is tolerating letrozole well without significant side effects. Currently, she feels well and is at her baseline. She has no neurologic complaints. She denies any pain. She has a good appetite and denies weight loss. She has no recent fevers or illnesses. She denies any chest pain or shortness of breath. She has no nausea, vomiting, constipation, or diarrhea. She has no urinary complaints. Patient feels at her baseline and offers no specific complaints today.  REVIEW OF SYSTEMS:   Review of Systems  Constitutional: Negative.  Negative for fever, malaise/fatigue and weight loss.  Respiratory: Negative.  Negative for cough and shortness of breath.   Cardiovascular: Negative.  Negative for chest pain and leg swelling.  Gastrointestinal: Negative.  Negative for abdominal pain.  Genitourinary: Negative.   Musculoskeletal: Negative.   Skin: Negative.  Negative for rash.  Neurological: Negative.  Negative for sensory change and weakness.  Psychiatric/Behavioral: Negative.  The patient is not nervous/anxious.     As per HPI. Otherwise, a complete review of systems is negative.  PAST MEDICAL HISTORY: Past Medical History:  Diagnosis Date  . Breast cancer ([place]) 05/13/2016   INVASIVE MAMMARY CARCINOMA RIGHT breast  . Pacemaker     PAST SURGICAL HISTORY: Past Surgical History:  Procedure Laterality Date  . BREAST BIOPSY Right    neg  . BREAST BIOPSY Right 05/13/2016   INVASIVE MAMMARY CARCINOMA     FAMILY HISTORY: Family History  Problem Relation Age of Onset  . Breast cancer Neg  Hx        ADVANCED DIRECTIVES:    HEALTH MAINTENANCE: Social History  Substance Use Topics  . Smoking status: Never Smoker  . Smokeless tobacco: Never Used  . Alcohol use No     Colonoscopy:  PAP:  Bone density:  Lipid panel:  No Known Allergies  Current Outpatient Prescriptions  Medication Sig Dispense Refill  . ALPRAZolam (XANAX) 0.25 MG tablet Take 1 tablet by mouth at bedtime.    Marland Kitchen aspirin EC 81 MG tablet Take 1 tablet by mouth daily.    . B Complex Vitamins (VITAMIN B COMPLEX PO) Take 1 tablet by mouth daily.    . Calcium Carbonate-Vitamin D (CALTRATE 600+D) 600-400 MG-UNIT tablet Take 1 tablet by mouth 2 (two) times daily.    . Cholecalciferol (VITAMIN D3) 2000 units capsule Take 1 capsule by mouth daily.    . Coenzyme Q10 (COQ-10 PO) Take 1 tablet by mouth daily.    Marland Kitchen letrozole (FEMARA) 2.5 MG tablet Take 1 tablet (2.5 mg total) by mouth daily. 90 tablet 1  . Omega-3 Fatty Acids (FISH OIL) 1200 MG CAPS Take 1 tablet by mouth 2 (two) times daily.    Marland Kitchen alendronate (FOSAMAX) 70 MG tablet Take 1 tablet (70 mg total) by mouth once a week. Take with a full glass of water on an empty stomach. 4 tablet 11   No current facility-administered medications for this visit.     OBJECTIVE: Vitals:   06/24/17 1353  BP: (!) 182/71  Pulse: 67  Resp: 18  Temp: (!) 97.1 F (36.2 C)     Body mass index is 24.1 kg/m.  ECOG FS:1 - Symptomatic but completely ambulatory  General: Well-developed, well-nourished, no acute distress. Eyes: Pink conjunctiva, anicteric sclera. Breasts: Right breast mass no longer palpable.  Lungs: Clear to auscultation bilaterally. Heart: Regular rate and rhythm. No rubs, murmurs, or gallops. Abdomen: Soft, nontender, nondistended. No organomegaly noted, normoactive bowel sounds. Musculoskeletal: No edema, cyanosis, or clubbing. Neuro: Alert, answering all questions appropriately. Cranial nerves grossly intact. Skin: No rashes or petechiae  noted. Psych: Normal affect.   LAB RESULTS:  No results found for: NA, K, CL, CO2, GLUCOSE, BUN, CREATININE, CALCIUM, PROT, ALBUMIN, AST, ALT, ALKPHOS, BILITOT, GFRNONAA, GFRAA  No results found for: WBC, NEUTROABS, HGB, HCT, MCV, PLT   STUDIES: Dg Bone Density  Result Date: 06/22/2017 EXAM: DUAL X-RAY ABSORPTIOMETRY (DXA) FOR BONE MINERAL DENSITY IMPRESSION: Dear Dr. Delight Hoh, Your patient April Mccarthy completed a BMD test on 06/22/2017 using the [place] (analysis version: 14.10) manufactured by [employer]. The following summarizes the results of our evaluation. PATIENT BIOGRAPHICAL: Name: April Mccarthy, April Mccarthy Patient ID: [id number] Birth Date: 1920/10/14 Height: 60.2 in. Gender: Female Exam Date: 06/22/2017 Weight: 125.9 lbs. Indications: Postmenopausal, Caucasian, Advanced Age, Breast CA, Height Loss Fractures: Treatments: Multi-Vitamin with calcium ASSESSMENT: The BMD measured at Forearm Radius 33% is 0.608 g/cm2 with a T-score of -3.1. This patient is considered osteoporotic according to [facility]) criteria. Lumbar spine was not utilized due to advanced degenerative changes. Site Region Measured Measured WHO Young Adult BMD Date       Age      Classification T-score DualFemur Neck Right 06/22/2017 95.1 Osteopenia -2.2 0.727 g/cm2 DualFemur Neck Right 06/17/2016 94.1 Osteopenia -2.3 0.719 g/cm2 Left Forearm Radius 33% 06/22/2017 95.1 Osteoporosis -3.1 0.608 g/cm2 Left Forearm Radius 33% 06/17/2016 94.1 Osteoporosis -2.9 0.618 g/cm2 World Health Organization [facility]) criteria for post-menopausal, Caucasian Women: Normal:       T-score at or above -1 SD Osteopenia:   T-score between -1 and -2.5 SD Osteoporosis: T-score at or below -2.5 SD RECOMMENDATIONS: [place] recommends that FDA-approved medical therapies be considered in postmenopausal women and men age 104 or older with a: 1. Hip or vertebral (clinical or morphometric) fracture. 2.  T-score of < -2.5 at the spine or hip. 3. Ten-year fracture probability by FRAX of 3% or greater for hip fracture or 20% or greater for major osteoporotic fracture. All treatment decisions require clinical judgment and consideration of individual patient factors, including patient preferences, co-morbidities, previous drug use, risk factors not captured in the FRAX model (e.g. falls, vitamin D deficiency, increased bone turnover, interval significant decline in bone density) and possible under - or over-estimation of fracture risk by FRAX. All patients should ensure an adequate intake of dietary calcium (1200 mg/d) and vitamin D (800 IU daily) unless contraindicated. FOLLOW-UP: People with diagnosed cases of osteoporosis or at high risk for fracture should have regular bone mineral density tests. For patients eligible for Medicare, routine testing is allowed once every 2 years. The testing frequency can be increased to one year for patients who have rapidly progressing disease, those who are receiving or discontinuing medical therapy to restore bone mass, or have additional risk factors. Mccarthy have reviewed this report, and agree with the above findings. [facility] Electronically Signed   By: Marijo Conception, M.D.   On: 06/22/2017 09:17   US Breast Ltd Uni Right Inc Axilla  Result Date: 06/22/2017 CLINICAL DATA:  83 year old female with invasive mammary carcinoma of the right breast diagnosed on core  needle biopsy 1 year ago. Patient is being treated with anastrozole. EXAM: 2D DIGITAL DIAGNOSTIC BILATERAL MAMMOGRAM WITH CAD AND ADJUNCT TOMO ULTRASOUND RIGHT BREAST COMPARISON:  Previous exam(s). ACR Breast Density Category b: There are scattered areas of fibroglandular density. FINDINGS: The biopsy-proven malignancy in the right breast, associated with a ribbon shaped marker has decreased in size and prominence. Due to its low-density and ill-defined margins, its size is difficult to measure  mammographically. There is no evidence of malignancy in the left breast. Cardiac pacemaker overlies the left superior breast. Mammographic images were processed with CAD. On physical exam, there is a small moderately firm palpable nodule measuring about 7 mm in the right 2 o'clock breast, middle depth. Targeted ultrasound is performed, showing right breast 2 o'clock 7 cm from the nipple hyperechoic ill defined horizontally oriented vascular mass which measures 1.7 x 1.7 x 0.7 cm. This corresponds to the biopsy-proven malignancy which measured 2.4 x 2.7 x 1.4 cm prior to starting treatment in June 2017. There is no evidence of right axillary lymphadenopathy. IMPRESSION: Interval decrease in the size and prominence of the known right breast 2 o'clock malignancy which now measures 1.7 cm in greatest dimension. RECOMMENDATION: Continue with plan of care for known right breast malignancy. No evidence of malignancy in the left breast. Mccarthy have discussed the findings and recommendations with the patient. Results were also provided in writing at the conclusion of the visit. If applicable, a reminder letter will be sent to the patient regarding the next appointment. BI-RADS CATEGORY  6: Known biopsy-proven malignancy. Electronically Signed   By: Fidela Salisbury M.D.   On: 06/22/2017 10:13   Mm Diag Breast Tomo Bilateral  Result Date: 06/22/2017 CLINICAL DATA:  83 year old female with invasive mammary carcinoma of the right breast diagnosed on core needle biopsy 1 year ago. Patient is being treated with anastrozole. EXAM: 2D DIGITAL DIAGNOSTIC BILATERAL MAMMOGRAM WITH CAD AND ADJUNCT TOMO ULTRASOUND RIGHT BREAST COMPARISON:  Previous exam(s). ACR Breast Density Category b: There are scattered areas of fibroglandular density. FINDINGS: The biopsy-proven malignancy in the right breast, associated with a ribbon shaped marker has decreased in size and prominence. Due to its low-density and ill-defined margins, its size is  difficult to measure mammographically. There is no evidence of malignancy in the left breast. Cardiac pacemaker overlies the left superior breast. Mammographic images were processed with CAD. On physical exam, there is a small moderately firm palpable nodule measuring about 7 mm in the right 2 o'clock breast, middle depth. Targeted ultrasound is performed, showing right breast 2 o'clock 7 cm from the nipple hyperechoic ill defined horizontally oriented vascular mass which measures 1.7 x 1.7 x 0.7 cm. This corresponds to the biopsy-proven malignancy which measured 2.4 x 2.7 x 1.4 cm prior to starting treatment in June 2017. There is no evidence of right axillary lymphadenopathy. IMPRESSION: Interval decrease in the size and prominence of the known right breast 2 o'clock malignancy which now measures 1.7 cm in greatest dimension. RECOMMENDATION: Continue with plan of care for known right breast malignancy. No evidence of malignancy in the left breast. Mccarthy have discussed the findings and recommendations with the patient. Results were also provided in writing at the conclusion of the visit. If applicable, a reminder letter will be sent to the patient regarding the next appointment. BI-RADS CATEGORY  6: Known biopsy-proven malignancy. Electronically Signed   By: Fidela Salisbury M.D.   On: 06/22/2017 10:13    ASSESSMENT: Clinical stage IIa ER/PR positive adenocarcinoma of  the upper inner quadrant of the right breast.   PLAN:    1.  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 Patient initiated letrozole in July 2017 with near complete resolution of her breast mass. Patient's most recent mammogram on June 22, 2017 revealed continued improvement of her known breast mass. Repeat in August 2019. Return to clinic in 6 months for routine evaluation.  2.  Osteoporosis: Baseline bone mineral density on June 22, 2017 reported a T score of -3.1. This is slightly worse than previous at -2.9. Continue Fosamax, calcium and vitamin D. Repeat in August 2019.  3. Hypertension: Patient's blood pressure is significantly elevated today. Continue monitoring and treatment per primary care.  Patient expressed understanding and was in agreement with this plan. She also understands that She can call clinic at any time with any questions, concerns, or complaints.   Cancer Staging Primary malignant neoplasm of upper inner quadrant of right female breast [facility]) Staging form: Breast, AJCC 7th Edition - Clinical stage from 06/02/2016: Stage IIA (T2, N0, M0) - Signed by Lloyd Huger, MD on 06/02/2016   Lloyd Huger, MD   06/25/2017 1:33 PM

## 2017-06-24 ENCOUNTER — Inpatient Hospital Stay: Payer: Medicare Other | Attending: Oncology | Admitting: Oncology

## 2017-06-24 VITALS — BP 182/71 | HR 67 | Temp 97.1°F | Resp 18 | Wt 124.4 lb

## 2017-06-24 DIAGNOSIS — Z79899 Other long term (current) drug therapy: Secondary | ICD-10-CM

## 2017-06-24 DIAGNOSIS — C50211 Malignant neoplasm of upper-inner quadrant of right female breast: Secondary | ICD-10-CM | POA: Insufficient documentation

## 2017-06-24 DIAGNOSIS — M810 Age-related osteoporosis without current pathological fracture: Secondary | ICD-10-CM | POA: Diagnosis not present

## 2017-06-24 DIAGNOSIS — Z79811 Long term (current) use of aromatase inhibitors: Secondary | ICD-10-CM | POA: Diagnosis not present

## 2017-06-24 DIAGNOSIS — Z7982 Long term (current) use of aspirin: Secondary | ICD-10-CM | POA: Insufficient documentation

## 2017-06-24 DIAGNOSIS — I10 Essential (primary) hypertension: Secondary | ICD-10-CM | POA: Diagnosis not present

## 2017-06-24 DIAGNOSIS — Z170 Estrogen receptor positive status [ER+]: Secondary | ICD-10-CM | POA: Diagnosis not present

## 2017-06-24 MED ORDER — ALENDRONATE SODIUM 70 MG PO TABS: 70.0000 mg | ORAL_TABLET | ORAL | 11 refills | Status: DC

## 2017-06-24 NOTE — Progress Notes (Signed)
Patient is here for follow up, she is doing well no complaints.  

## 2017-12-02 ENCOUNTER — Other Ambulatory Visit: Payer: Self-pay | Admitting: Internal Medicine

## 2017-12-17 NOTE — Progress Notes (Signed)
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   INTERVAL HISTORY: Patient returns to clinic for routine evaluation. She continues to tolerate letrozole well without significant side effects. Currently, she feels well and is at her baseline. She has no neurologic complaints. She denies any pain. She has a good appetite and denies weight loss. She has no recent fevers or illnesses. She denies any chest pain or shortness of breath. She has no nausea, vomiting, constipation, or diarrhea. She has no urinary complaints. Patient offers no specific complaints today.  REVIEW OF SYSTEMS:   Review of Systems  Constitutional: Negative.  Negative for fever, malaise/fatigue and weight loss.  Respiratory: Negative.  Negative for cough and shortness of breath.   Cardiovascular: Negative.  Negative for chest pain and leg swelling.  Gastrointestinal: Negative.  Negative for abdominal pain.  Genitourinary: Negative.   Musculoskeletal: Negative.   Skin: Negative.  Negative for rash.  Neurological: Negative.  Negative for sensory change and weakness.  Psychiatric/Behavioral: Negative.  The patient is not nervous/anxious.     As per HPI. Otherwise, a complete review of systems is negative.  PAST MEDICAL HISTORY: Past Medical History:  Diagnosis Date  . Breast cancer ([place]) 05/13/2016   INVASIVE MAMMARY CARCINOMA RIGHT breast  . Pacemaker     PAST SURGICAL HISTORY: Past Surgical History:  Procedure Laterality Date  . BREAST BIOPSY Right    neg  . BREAST BIOPSY Right 05/13/2016   INVASIVE MAMMARY CARCINOMA     FAMILY HISTORY: Family History  Problem Relation Age of Onset  . Breast cancer Neg Hx         ADVANCED DIRECTIVES:    HEALTH MAINTENANCE: Social History   Tobacco Use  . Smoking status: Never Smoker  . Smokeless tobacco: Never Used  Substance Use Topics  . Alcohol use: No    Alcohol/week: 0.0 oz  . Drug use: No     Colonoscopy:  PAP:  Bone density:  Lipid panel:  No Known Allergies  Current Outpatient Medications  Medication Sig Dispense Refill  . alendronate (FOSAMAX) 70 MG tablet Take 1 tablet (70 mg total) by mouth once a week. Take with a full glass of water on an empty stomach. 4 tablet 11  . aspirin EC 81 MG tablet Take 1 tablet by mouth daily.    . B Complex Vitamins (VITAMIN B COMPLEX PO) Take 1 tablet by mouth daily.    . Calcium Carbonate-Vitamin D (CALTRATE 600+D) 600-400 MG-UNIT tablet Take 1 tablet by mouth 2 (two) times daily.    . Cholecalciferol (VITAMIN D3) 2000 units capsule Take 1 capsule by mouth daily.    . Coenzyme Q10 (COQ-10 PO) Take 1 tablet by mouth daily.    Marland Kitchen letrozole (FEMARA) 2.5 MG tablet TAKE 1 TABLET BY MOUTH EVERY DAY 90 tablet 1  . Omega-3 Fatty Acids (FISH OIL) 1200 MG CAPS Take 1 tablet by mouth 2 (two) times daily.     No current facility-administered medications for this visit.     OBJECTIVE: Vitals:   12/22/17 1119  BP: (!) 160/80  Pulse: 90  Temp: 98.8 F (37.1 C)     Body mass index is 24.24 kg/m.    ECOG FS:0 - Asymptomatic  General: Well-developed, well-nourished,  no acute distress. Eyes: Pink conjunctiva, anicteric sclera. Breasts: Right breast mass no longer palpable.  Exam deferred today. Lungs: Clear to auscultation bilaterally. Heart: Regular rate and rhythm. No rubs, murmurs, or gallops. Abdomen: Soft, nontender, nondistended. No organomegaly noted, normoactive bowel sounds. Musculoskeletal: No edema, cyanosis, or clubbing. Neuro: Alert, answering all questions appropriately. Cranial nerves grossly intact. Skin: No rashes or petechiae noted. Psych: Normal affect.   LAB RESULTS:  No results  found for: NA, K, CL, CO2, GLUCOSE, BUN, CREATININE, CALCIUM, PROT, ALBUMIN, AST, ALT, ALKPHOS, BILITOT, GFRNONAA, GFRAA  No results found for: WBC, NEUTROABS, HGB, HCT, MCV, PLT   STUDIES: No results found.  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   PLAN:    1.  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 Patient initiated letrozole in July 2017 with near complete resolution of her breast mass. Patient's most recent mammogram on June 22, 2017 revealed continued improvement of her known breast mass. Repeat in August 2019. Return to clinic in 6 months for routine evaluation.  2. Osteoporosis: Baseline bone mineral density on June 22, 2017 reported a T score of -3.1. This is slightly worse than previous at -2.9. Continue Fosamax, calcium and vitamin D. Repeat in August 2019.  3. Hypertension: Patient's blood pressure is elevated today. Continue monitoring and treatment per primary care.  Approximately 20 minutes spent in discussion of which greater than 50% was consultation.  Patient expressed understanding and was in agreement with this plan. She also understands that She can call clinic at any time with any questions, concerns, or complaints.   Cancer Staging Primary malignant neoplasm of upper inner quadrant of right female breast [facility]) Staging form: Breast, AJCC 7th Edition - Clinical stage from 06/02/2016: Stage IIA (T2, N0, M0) - Signed by Lloyd Huger, MD on 06/02/2016   Lloyd Huger, MD   12/25/2017 7:33 AM

## 2017-12-22 ENCOUNTER — Other Ambulatory Visit: Payer: Self-pay

## 2017-12-22 ENCOUNTER — Encounter: Payer: Self-pay | Admitting: Oncology

## 2017-12-22 ENCOUNTER — Inpatient Hospital Stay: Payer: Medicare Other | Attending: Oncology | Admitting: Oncology

## 2017-12-22 VITALS — BP 160/80 | HR 90 | Temp 98.8°F | Wt 125.1 lb

## 2017-12-22 DIAGNOSIS — C50211 Malignant neoplasm of upper-inner quadrant of right female breast: Secondary | ICD-10-CM

## 2017-12-22 DIAGNOSIS — Z170 Estrogen receptor positive status [ER+]: Secondary | ICD-10-CM | POA: Diagnosis not present

## 2017-12-22 DIAGNOSIS — Z7982 Long term (current) use of aspirin: Secondary | ICD-10-CM

## 2017-12-22 DIAGNOSIS — I10 Essential (primary) hypertension: Secondary | ICD-10-CM | POA: Diagnosis not present

## 2017-12-22 DIAGNOSIS — Z780 Asymptomatic menopausal state: Secondary | ICD-10-CM

## 2017-12-22 DIAGNOSIS — Z79811 Long term (current) use of aromatase inhibitors: Secondary | ICD-10-CM

## 2017-12-22 DIAGNOSIS — M810 Age-related osteoporosis without current pathological fracture: Secondary | ICD-10-CM | POA: Diagnosis not present

## 2017-12-22 DIAGNOSIS — Z79899 Other long term (current) drug therapy: Secondary | ICD-10-CM | POA: Diagnosis not present

## 2018-05-30 ENCOUNTER — Other Ambulatory Visit: Payer: Self-pay | Admitting: *Deleted

## 2018-05-30 MED ORDER — ALENDRONATE SODIUM 70 MG PO TABS: 70.0000 mg | ORAL_TABLET | ORAL | 3 refills | Status: DC

## 2018-06-23 ENCOUNTER — Ambulatory Visit
Admission: RE | Admit: 2018-06-23 | Discharge: 2018-06-23 | Disposition: A | Discharge status: Home / Self Care | Payer: Medicare Other | Source: Ambulatory Visit | Attending: Oncology | Admitting: Oncology

## 2018-06-23 ENCOUNTER — Other Ambulatory Visit: Payer: Self-pay | Admitting: Oncology

## 2018-06-23 DIAGNOSIS — C50211 Malignant neoplasm of upper-inner quadrant of right female breast: Secondary | ICD-10-CM | POA: Diagnosis present

## 2018-06-23 DIAGNOSIS — Z780 Asymptomatic menopausal state: Secondary | ICD-10-CM | POA: Insufficient documentation

## 2018-06-28 NOTE — Progress Notes (Signed)
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   INTERVAL HISTORY: Patient returns to clinic today for routine six-month evaluation.  She continues to tolerate letrozole well without significant side effects, although is concerned about the financial burden of her medications.  She currently feels well and is asymptomatic. She has no neurologic complaints. She denies any pain. She has a good appetite and denies weight loss. She has no recent fevers or illnesses. She denies any chest pain or shortness of breath. She has no nausea, vomiting, constipation, or diarrhea. She has no urinary complaints.  Patient feels at her baseline offers no specific complaints today.  REVIEW OF SYSTEMS:   Review of Systems  Constitutional: Negative.  Negative for fever, malaise/fatigue and weight loss.  Respiratory: Negative.  Negative for cough and shortness of breath.   Cardiovascular: Negative.  Negative for chest pain and leg swelling.  Gastrointestinal: Negative.  Negative for abdominal pain.  Genitourinary: Negative.   Musculoskeletal: Negative.   Skin: Negative.  Negative for rash.  Neurological: Negative.  Negative for sensory change and weakness.  Psychiatric/Behavioral: Negative.  The patient is not nervous/anxious.     As per HPI. Otherwise, a complete review of systems is negative.  PAST MEDICAL HISTORY: Past Medical History:  Diagnosis Date  . Breast cancer ([place]) 05/13/2016   INVASIVE MAMMARY CARCINOMA RIGHT breast  . Pacemaker     PAST SURGICAL HISTORY: Past Surgical History:  Procedure Laterality Date  . BREAST BIOPSY Right    neg  . BREAST BIOPSY Right 05/13/2016   INVASIVE MAMMARY CARCINOMA      FAMILY HISTORY: Family History  Problem Relation Age of Onset  . Breast cancer Neg Hx        ADVANCED DIRECTIVES:    HEALTH MAINTENANCE: Social History   Tobacco Use  . Smoking status: Never Smoker  . Smokeless tobacco: Never Used  Substance Use Topics  . Alcohol use: No    Alcohol/week: 0.0 standard drinks  . Drug use: No     Colonoscopy:  PAP:  Bone density:  Lipid panel:  No Known Allergies  Current Outpatient Medications  Medication Sig Dispense Refill  . alendronate (FOSAMAX) 70 MG tablet Take 1 tablet (70 mg total) by mouth once a week. Take with a full glass of water on an empty stomach. 12 tablet 3  . aspirin EC 81 MG tablet Take 1 tablet by mouth daily.    . B Complex Vitamins (VITAMIN B COMPLEX PO) Take 1 tablet by mouth daily.    . Calcium Carbonate-Vitamin D (CALTRATE 600+D) 600-400 MG-UNIT tablet Take 1 tablet by mouth 2 (two) times daily.    . Cholecalciferol (VITAMIN D3) 2000 units capsule Take 1 capsule by mouth daily.    . Coenzyme Q10 (COQ-10 PO) Take 1 tablet by mouth daily.    . Omega-3 Fatty Acids (FISH OIL) 1200 MG CAPS Take 1 tablet by mouth 2 (two) times daily.    . tamoxifen (NOLVADEX) 20 MG tablet Take 1 tablet (20 mg total) by mouth daily. 30 tablet 6   No current facility-administered medications for this visit.     OBJECTIVE: Vitals:   06/29/18 1133  BP: (!) 155/85  Pulse: 75  Resp: 18  Temp: 97.6 F (  36.4 C)     Body mass index is 23.66 kg/m.    ECOG FS:0 - Asymptomatic  General: Well-developed, well-nourished, no acute distress. Eyes: Pink conjunctiva, anicteric sclera. HEENT: Normocephalic, moist mucous membranes. Breast: Right breast mass no longer palpable. Lungs: Clear to auscultation bilaterally. Heart: Regular rate and rhythm. No rubs, murmurs, or gallops. Abdomen: Soft, nontender, nondistended. No organomegaly noted, normoactive bowel sounds. Musculoskeletal: No edema, cyanosis, or clubbing. Neuro: Alert,  answering all questions appropriately. Cranial nerves grossly intact. Skin: No rashes or petechiae noted. Psych: Normal affect.  LAB RESULTS:  No results found for: NA, K, CL, CO2, GLUCOSE, BUN, CREATININE, CALCIUM, PROT, ALBUMIN, AST, ALT, ALKPHOS, BILITOT, GFRNONAA, GFRAA  No results found for: WBC, NEUTROABS, HGB, HCT, MCV, PLT   STUDIES: Dg Bone Density  Result Date: 06/23/2018 EXAM: DUAL X-RAY ABSORPTIOMETRY (DXA) FOR BONE MINERAL DENSITY IMPRESSION: Dear Dr Grayland Ormond, Your patient Kaelea Gathright completed a BMD test on 06/23/2018 using the [place] (analysis version: 14.10) manufactured by [employer]. The following summarizes the results of our evaluation. PATIENT BIOGRAPHICAL: Name: Kadyn, Chovan I Patient ID:  [id number] Birth Date: [birth year] Height:     59.5 in. Gender:      Female Exam Date:  06/23/2018 Weight:     121.5 lbs. Indications: Advanced Age, Breast CA, Caucasian, Height Loss, High Risk Meds, Osteoporotic, Postmenopausal Fractures: Treatments: calcium w/ vit D, Femara, Fosamax, Multi-Vitamin with calcium, Vitamin D ASSESSMENT: The BMD measured at Forearm Radius 33% is 0.550 g/cm2 with a T-score of -3.7. This patient is considered osteoporotic according to [facility]) criteria. Lumbar spine was not utilized due to advanced degenerative changes. Site Region Measured Date Measured Age WHO Classification Young Adult T-score BMD DualFemur Neck Right 06/23/2018 96.1 Osteopenia -2.1 0.745 g/cm2 DualFemur Neck Right 06/22/2017 95.1 Osteopenia -2.2 0.727 g/cm2 DualFemur Neck Right 06/17/2016 94.1 Osteopenia -2.3 0.719 g/cm2 DualFemur Total Mean 06/23/2018 96.1 Osteopenia -1.7 0.787 g/cm2 DualFemur Total Mean 06/22/2017 95.1 Osteopenia -1.6 0.800 g/cm2 DualFemur Total Mean 06/17/2016 94.1 Osteopenia -1.4 0.826 g/cm2 Left Forearm Radius 33% 06/23/2018 96.1 Osteoporosis -3.7 0.550 g/cm2 Left Forearm Radius 33% 06/22/2017 95.1 Osteoporosis -3.1 0.608 g/cm2 Left  Forearm Radius 33% 06/17/2016 94.1 Osteoporosis -2.9 0.618 g/cm2 World Health Organization [facility]) criteria for post-menopausal, Caucasian Women: Normal:       T-score at or above -1 SD Osteopenia:   T-score between -1 and -2.5 SD Osteoporosis: T-score at or below -2.5 SD RECOMMENDATIONS: 1. All patients should optimize calcium and vitamin D intake. 2. Consider FDA-approved medical therapies in postmenopausal women and men aged 88 years and older, based on the following: a. A hip or vertebral(clinical or morphometric) fracture b. T-score < -2.5 at the femoral neck or spine after appropriate evaluation to exclude secondary causes c. Low bone mass (T-score between -1.0 and -2.5 at the femoral neck or spine) and a 10-year probability of a hip fracture > 3% or a 10-year probability of a major osteoporosis-related fracture > 20% based on the [place]-adapted WHO algorithm d. Clinician judgment and/or patient preferences may indicate treatment for people with 10-year fracture probabilities above or below these levels FOLLOW-UP: People with diagnosed cases of osteoporosis or at high risk for fracture should have regular bone mineral density tests. For patients eligible for Medicare, routine testing is allowed once every 2 years. The testing frequency can be increased to one year for patients who have rapidly progressing disease, those who are receiving or discontinuing medical therapy to restore bone mass, or have  additional risk factors. I have reviewed this report, and agree with the above findings. Interpreted by: Peggye Fothergill, MD, CCD (Certified Clinical Densitometrist) [facility] Electronically Signed   By: Evangeline Dakin M.D.   On: 06/23/2018 11:15   US Breast Ltd Uni Right Inc Axilla  Result Date: 06/23/2018 CLINICAL DATA:  Personal history of right breast cancer status post biopsy 2017. The patient did not have surgery. The patient is on anastrozole. EXAM: DIGITAL DIAGNOSTIC BILATERAL MAMMOGRAM WITH  CAD AND TOMO ULTRASOUND RIGHT BREAST COMPARISON:  Previous exam(s). ACR Breast Density Category b: There are scattered areas of fibroglandular density. FINDINGS: Cc and MLO views of bilateral breasts, spot compression right CC view, spot magnification CC and lateral views of the right breast are submitted. The previously biopsy proven cancer with ribbon biopsy in place in the medial right breast is unchanged compared to prior exam of 2018. There are calcifications in the upper-outer quadrant right breast which are may be vascular. The left breasts is negative. Mammographic images were processed with CAD. Targeted ultrasound is performed, showing 1.1 cm hyperechoic area at the right breast 2 o'clock 7 cm from nipple not significantly the changed compared prior exam. IMPRESSION: Known cancer. RECOMMENDATION: Management on clinical basis. Patient has calcifications in the upper-outer quadrant right breast which may be vascular. The area of previously biopsy cancer is unchanged in the right breast. Typically, patient will return in 6 months for evaluation of calcifications upper-outer quadrant right breast, but in this patient with limited mobility and patient's age, the frequency of assessment depends on clinical assessment. I have discussed the findings and recommendations with the patient. Results were also provided in writing at the conclusion of the visit. If applicable, a reminder letter will be sent to the patient regarding the next appointment. BI-RADS CATEGORY  6: Known biopsy-proven malignancy. Electronically Signed   By: Abelardo Diesel M.D.   On: 06/23/2018 12:41   Mm Diag Breast Tomo Bilateral  Result Date: 06/23/2018 CLINICAL DATA:  Personal history of right breast cancer status post biopsy 2017. The patient did not have surgery. The patient is on anastrozole. EXAM: DIGITAL DIAGNOSTIC BILATERAL MAMMOGRAM WITH CAD AND TOMO ULTRASOUND RIGHT BREAST COMPARISON:  Previous exam(s). ACR Breast Density Category  b: There are scattered areas of fibroglandular density. FINDINGS: Cc and MLO views of bilateral breasts, spot compression right CC view, spot magnification CC and lateral views of the right breast are submitted. The previously biopsy proven cancer with ribbon biopsy in place in the medial right breast is unchanged compared to prior exam of 2018. There are calcifications in the upper-outer quadrant right breast which are may be vascular. The left breasts is negative. Mammographic images were processed with CAD. Targeted ultrasound is performed, showing 1.1 cm hyperechoic area at the right breast 2 o'clock 7 cm from nipple not significantly the changed compared prior exam. IMPRESSION: Known cancer. RECOMMENDATION: Management on clinical basis. Patient has calcifications in the upper-outer quadrant right breast which may be vascular. The area of previously biopsy cancer is unchanged in the right breast. Typically, patient will return in 6 months for evaluation of calcifications upper-outer quadrant right breast, but in this patient with limited mobility and patient's age, the frequency of assessment depends on clinical assessment. I have discussed the findings and recommendations with the patient. Results were also provided in writing at the conclusion of the visit. If applicable, a reminder letter will be sent to the patient regarding the next appointment. BI-RADS CATEGORY  6: Known  biopsy-proven malignancy. Electronically Signed   By: Abelardo Diesel M.D.   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   PLAN:    1.  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 Patient initiated letrozole in July 2017 with near complete resolution of her breast mass.  Because of  patient's severe and worsening osteoporosis, letrozole has been discontinued and patient was given a prescription for tamoxifen.  Patient's most recent mammogram on June 23, 2018 revealed persistent disease, but essentially unchanged from one year prior.  Repeat in August 2020.  Return to clinic in 6 months for routine evaluation.   2. Osteoporosis: Bone mineral density on June 23, 2018 reported T score of -3.7 which is significantly worse than previous.  Patient also reports that Fosamax is a financial burden.  Return to clinic in 1 week for Prolia and then in 6 months for further follow-up and continuation of Prolia.  Discontinue letrozole and initiate tamoxifen as above.  Patient has been instructed to continue calcium and vitamin D supplementation.    I spent a total of 30 minutes face-to-face with the patient of which greater than 50% of the visit was spent in counseling and coordination of care as detailed above.   Patient expressed understanding and was in agreement with this plan. She also understands that She can call clinic at any time with any questions, concerns, or complaints.   Cancer Staging Primary malignant neoplasm of upper inner quadrant of right female breast [facility]) Staging form: Breast, AJCC 7th Edition - Clinical stage from 06/02/2016: Stage IIA (T2, N0, M0) - Signed by Lloyd Huger, MD on 06/02/2016   Lloyd Huger, MD   07/01/2018 7:29 PM

## 2018-06-29 ENCOUNTER — Encounter: Payer: Self-pay | Admitting: Oncology

## 2018-06-29 ENCOUNTER — Inpatient Hospital Stay: Payer: Medicare Other | Attending: Oncology | Admitting: Oncology

## 2018-06-29 VITALS — BP 155/85 | HR 75 | Temp 97.6°F | Resp 18 | Wt 122.1 lb

## 2018-06-29 DIAGNOSIS — Z79899 Other long term (current) drug therapy: Secondary | ICD-10-CM | POA: Diagnosis not present

## 2018-06-29 DIAGNOSIS — M810 Age-related osteoporosis without current pathological fracture: Secondary | ICD-10-CM | POA: Insufficient documentation

## 2018-06-29 DIAGNOSIS — Z7982 Long term (current) use of aspirin: Secondary | ICD-10-CM | POA: Insufficient documentation

## 2018-06-29 DIAGNOSIS — Z170 Estrogen receptor positive status [ER+]: Secondary | ICD-10-CM | POA: Insufficient documentation

## 2018-06-29 DIAGNOSIS — C50211 Malignant neoplasm of upper-inner quadrant of right female breast: Secondary | ICD-10-CM | POA: Insufficient documentation

## 2018-06-29 DIAGNOSIS — Z79811 Long term (current) use of aromatase inhibitors: Secondary | ICD-10-CM | POA: Diagnosis not present

## 2018-06-29 MED ORDER — TAMOXIFEN CITRATE 20 MG PO TABS: 20.0000 mg | ORAL_TABLET | Freq: Every day | ORAL | 6 refills | Status: DC

## 2018-06-29 NOTE — Progress Notes (Signed)
Patient here today for follow up regarding breast cancer. Patient reports her medications have become to expensive for her, she is paying over $400 per month for letrozole and fosamax together.

## 2018-07-01 DIAGNOSIS — M810 Age-related osteoporosis without current pathological fracture: Secondary | ICD-10-CM | POA: Insufficient documentation

## 2018-07-06 ENCOUNTER — Inpatient Hospital Stay: Payer: Medicare Other

## 2018-07-06 ENCOUNTER — Other Ambulatory Visit: Payer: Self-pay

## 2018-07-06 DIAGNOSIS — M810 Age-related osteoporosis without current pathological fracture: Secondary | ICD-10-CM | POA: Diagnosis not present

## 2018-07-06 DIAGNOSIS — C50211 Malignant neoplasm of upper-inner quadrant of right female breast: Secondary | ICD-10-CM

## 2018-07-06 LAB — BASIC METABOLIC PANEL
ANION GAP: 9 (ref 5–15)
BUN: 21 mg/dL (ref 8–23)
CHLORIDE: 102 mmol/L (ref 98–111)
CO2: 29 mmol/L (ref 22–32)
Calcium: 10.4 mg/dL — ABNORMAL HIGH (ref 8.9–10.3)
Creatinine, Ser: 0.79 mg/dL (ref 0.44–1.00)
GFR calc Af Amer: >60 mL/min (ref >60)
Glucose, Bld: 95 mg/dL (ref 70–99)
POTASSIUM: 4.7 mmol/L (ref 3.5–5.1)
Sodium: 140 mmol/L (ref 135–145)

## 2018-07-06 MED ORDER — DENOSUMAB 60 MG/ML ~~LOC~~ SOSY
60.0000 mg | PREFILLED_SYRINGE | Freq: Once | SUBCUTANEOUS | Status: AC
  Administered 2018-07-06: 60 mg via SUBCUTANEOUS
  Filled 2018-07-06: qty 1

## 2018-12-22 ENCOUNTER — Other Ambulatory Visit: Payer: Self-pay | Admitting: *Deleted

## 2018-12-22 MED ORDER — TAMOXIFEN CITRATE 20 MG PO TABS: 20.0000 mg | ORAL_TABLET | Freq: Every day | ORAL | 1 refills | Status: DC

## 2019-01-01 NOTE — Progress Notes (Deleted)
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   INTERVAL HISTORY: Patient returns to clinic today for routine six-month evaluation.  She continues to tolerate letrozole well without significant side effects, although is concerned about the financial burden of her medications.  She currently feels well and is asymptomatic. She has no neurologic complaints. She denies any pain. She has a good appetite and denies weight loss. She has no recent fevers or illnesses. She denies any chest pain or shortness of breath. She has no nausea, vomiting, constipation, or diarrhea. She has no urinary complaints.  Patient feels at her baseline offers no specific complaints today.  REVIEW OF SYSTEMS:   Review of Systems  Constitutional: Negative.  Negative for fever, malaise/fatigue and weight loss.  Respiratory: Negative.  Negative for cough and shortness of breath.   Cardiovascular: Negative.  Negative for chest pain and leg swelling.  Gastrointestinal: Negative.  Negative for abdominal pain.  Genitourinary: Negative.   Musculoskeletal: Negative.   Skin: Negative.  Negative for rash.  Neurological: Negative.  Negative for sensory change and weakness.  Psychiatric/Behavioral: Negative.  The patient is not nervous/anxious.     As per HPI. Otherwise, a complete review of systems is negative.  PAST MEDICAL HISTORY: Past Medical History:  Diagnosis Date  . Breast cancer ([place]) 05/13/2016   INVASIVE MAMMARY CARCINOMA RIGHT breast  . Pacemaker     PAST SURGICAL HISTORY: Past Surgical History:  Procedure Laterality Date  . BREAST BIOPSY Right    neg  . BREAST BIOPSY Right 05/13/2016   INVASIVE MAMMARY CARCINOMA      FAMILY HISTORY: Family History  Problem Relation Age of Onset  . Breast cancer Neg Hx        ADVANCED DIRECTIVES:    HEALTH MAINTENANCE: Social History   Tobacco Use  . Smoking status: Never Smoker  . Smokeless tobacco: Never Used  Substance Use Topics  . Alcohol use: No    Alcohol/week: 0.0 standard drinks  . Drug use: No     Colonoscopy:  PAP:  Bone density:  Lipid panel:  No Known Allergies  Current Outpatient Medications  Medication Sig Dispense Refill  . alendronate (FOSAMAX) 70 MG tablet Take 1 tablet (70 mg total) by mouth once a week. Take with a full glass of water on an empty stomach. 12 tablet 3  . aspirin EC 81 MG tablet Take 1 tablet by mouth daily.    . B Complex Vitamins (VITAMIN B COMPLEX PO) Take 1 tablet by mouth daily.    . Calcium Carbonate-Vitamin D (CALTRATE 600+D) 600-400 MG-UNIT tablet Take 1 tablet by mouth 2 (two) times daily.    . Cholecalciferol (VITAMIN D3) 2000 units capsule Take 1 capsule by mouth daily.    . Coenzyme Q10 (COQ-10 PO) Take 1 tablet by mouth daily.    . Omega-3 Fatty Acids (FISH OIL) 1200 MG CAPS Take 1 tablet by mouth 2 (two) times daily.    . tamoxifen (NOLVADEX) 20 MG tablet Take 1 tablet (20 mg total) by mouth daily. 90 tablet 1   No current facility-administered medications for this visit.     OBJECTIVE: There were no vitals filed for this visit.   There is no height or weight on file to  calculate BMI.    ECOG FS:0 - Asymptomatic  General: Well-developed, well-nourished, no acute distress. Eyes: Pink conjunctiva, anicteric sclera. HEENT: Normocephalic, moist mucous membranes. Breast: Right breast mass no longer palpable. Lungs: Clear to auscultation bilaterally. Heart: Regular rate and rhythm. No rubs, murmurs, or gallops. Abdomen: Soft, nontender, nondistended. No organomegaly noted, normoactive bowel sounds. Musculoskeletal: No edema, cyanosis, or clubbing. Neuro: Alert, answering all questions  appropriately. Cranial nerves grossly intact. Skin: No rashes or petechiae noted. Psych: Normal affect.  LAB RESULTS:  Lab Results  Component Value Date   NA 140 07/06/2018   K 4.7 07/06/2018   CL 102 07/06/2018   CO2 29 07/06/2018   GLUCOSE 95 07/06/2018   BUN 21 07/06/2018   CREATININE 0.79 07/06/2018   CALCIUM 10.4 (H) 07/06/2018   GFRNONAA >60 07/06/2018   GFRAA >60 07/06/2018    No results found for: WBC, NEUTROABS, HGB, HCT, MCV, PLT   STUDIES: No results found.  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   PLAN:    1.  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 Patient initiated letrozole in July 2017 with near complete resolution of her breast mass.  Because of patient's severe and worsening osteoporosis, letrozole has been discontinued and patient was given a prescription for tamoxifen.  Patient's most recent mammogram on June 23, 2018 revealed persistent disease, but essentially unchanged from one year prior.  Repeat in August 2020.  Return to clinic in 6 months for routine evaluation.   2. Osteoporosis: Bone mineral density on June 23, 2018 reported T score of -3.7 which is significantly worse than previous.  Patient also reports that Fosamax is a financial burden.  Return to clinic in 1 week for Prolia and then in 6 months for further follow-up and continuation of Prolia.  Discontinue letrozole and initiate tamoxifen as above.  Patient has been instructed to continue calcium and vitamin D supplementation.    I spent a total of 30 minutes face-to-face with the patient of which greater than 50% of the visit was spent in counseling and coordination of care as detailed above.   Patient expressed understanding and was in agreement with this plan.  She also understands that She can call clinic at any time with any questions, concerns, or complaints.   Cancer Staging Primary malignant neoplasm of upper inner quadrant of right female breast [facility]) Staging form: Breast, AJCC 7th Edition - Clinical stage from 06/02/2016: Stage IIA (T2, N0, M0) - Signed by Lloyd Huger, MD on 06/02/2016   Lloyd Huger, MD   01/01/2019 11:36 PM

## 2019-01-05 ENCOUNTER — Inpatient Hospital Stay: Payer: Medicare Other

## 2019-01-05 ENCOUNTER — Inpatient Hospital Stay: Payer: Medicare Other | Admitting: Oncology

## 2019-01-23 NOTE — Progress Notes (Signed)
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   INTERVAL HISTORY: Patient returns to clinic today for routine 11-month follow-up and continuation of Prolia.  She currently feels well and is asymptomatic.  She is tolerating tamoxifen without significant side effects. She has no neurologic complaints. She denies any pain. She has a good appetite and denies weight loss. She has no recent fevers or illnesses. She denies any chest pain or shortness of breath. She has no nausea, vomiting, constipation, or diarrhea. She has no urinary complaints.  Patient feels at her baseline offers no specific complaints today.  REVIEW OF SYSTEMS:   Review of Systems  Constitutional: Negative.  Negative for fever, malaise/fatigue and weight loss.  Respiratory: Negative.  Negative for cough and shortness of breath.   Cardiovascular: Negative.  Negative for chest pain and leg swelling.  Gastrointestinal: Negative.  Negative for abdominal pain.  Genitourinary: Negative.  Negative for dysuria.  Musculoskeletal: Negative.  Negative for back pain.  Skin: Negative.  Negative for rash.  Neurological: Negative.  Negative for dizziness, sensory change, focal weakness, weakness and headaches.  Psychiatric/Behavioral: Negative.  The patient is not nervous/anxious.     As per HPI. Otherwise, a complete review of systems is negative.  PAST MEDICAL HISTORY: Past Medical History:  Diagnosis Date  . Breast cancer ([place]) 05/13/2016   INVASIVE MAMMARY CARCINOMA RIGHT breast  . Pacemaker     PAST SURGICAL HISTORY: Past Surgical History:  Procedure Laterality Date  . BREAST BIOPSY Right    neg  . BREAST BIOPSY Right 05/13/2016   INVASIVE MAMMARY CARCINOMA     FAMILY HISTORY: Family History  Problem Relation Age of Onset  . Breast cancer Neg Hx        ADVANCED DIRECTIVES:    HEALTH MAINTENANCE: Social History   Tobacco Use  . Smoking status: Never Smoker  . Smokeless tobacco: Never Used  Substance Use Topics  . Alcohol use: No    Alcohol/week: 0.0 standard drinks  . Drug use: No     Colonoscopy:  PAP:  Bone density:  Lipid panel:  No Known Allergies  Current Outpatient Medications  Medication Sig Dispense Refill  . ALPRAZolam (XANAX) 0.25 MG tablet Take 0.25 mg by mouth at bedtime.    Marland Kitchen aspirin EC 81 MG tablet Take 1 tablet by mouth daily.    . B Complex Vitamins (VITAMIN B COMPLEX PO) Take 1 tablet by mouth daily.    . Calcium Carbonate-Vitamin D (CALTRATE 600+D) 600-400 MG-UNIT tablet Take 1 tablet by mouth 2 (two) times daily.    . Cholecalciferol (VITAMIN D3) 2000 units capsule Take 1 capsule by mouth daily.    . Coenzyme Q10 (COQ-10 PO) Take 1 tablet by mouth daily.    . Omega-3 Fatty Acids (FISH OIL) 1200 MG CAPS Take 1 tablet by mouth 2 (two) times daily.    . tamoxifen (NOLVADEX) 20 MG tablet Take 1 tablet (20 mg total) by mouth daily. 90 tablet 1  . denosumab (PROLIA) 60 MG/ML SOSY injection Inject 60 mg into the skin every 6 (six) months.     No current facility-administered medications for this visit.    Facility-Administered Medications Ordered in Other Visits  Medication Dose Route Frequency Provider Last Rate Last  Dose  . denosumab (PROLIA) injection 60 mg  60 mg Subcutaneous Once Lloyd Huger, MD        OBJECTIVE: Vitals:   01/24/19 0927  BP: (!) 183/74  Pulse: 78  Resp: 18  Temp: (!) 96.8 F (36 C)     Body mass index is 23.08 kg/m.    ECOG FS:0 - Asymptomatic  General: Well-developed, well-nourished, no acute distress. Eyes: Pink conjunctiva, anicteric sclera. HEENT: Normocephalic, moist mucous membranes. Breast: Patient declined exam today. Lungs:  Clear to auscultation bilaterally. Heart: Regular rate and rhythm. No rubs, murmurs, or gallops. Abdomen: Soft, nontender, nondistended. No organomegaly noted, normoactive bowel sounds. Musculoskeletal: No edema, cyanosis, or clubbing. Neuro: Alert, answering all questions appropriately. Cranial nerves grossly intact. Skin: No rashes or petechiae noted. Psych: Normal affect.  LAB RESULTS:  Lab Results  Component Value Date   NA 138 01/24/2019   K 4.2 01/24/2019   CL 104 01/24/2019   CO2 26 01/24/2019   GLUCOSE 99 01/24/2019   BUN 18 01/24/2019   CREATININE 0.87 01/24/2019   CALCIUM 9.7 01/24/2019   GFRNONAA 56 (L) 01/24/2019   GFRAA >60 01/24/2019    No results found for: WBC, NEUTROABS, HGB, HCT, MCV, PLT   STUDIES: No results found.  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   PLAN:    1.  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 Patient initiated letrozole in July 2017 with near complete resolution of her breast mass.  Because of patient's severe and worsening osteoporosis, letrozole has been discontinued and patient was given a prescription for tamoxifen which she will likely take indefinitely.  Patient's most recent mammogram on June 23, 2018 revealed persistent disease, but essentially unchanged from one year prior.  Repeat mammogram in August 2020.  Return to clinic in 6 months for routine evaluation and continuation of Prolia. 2. Osteoporosis: Bone mineral density on June 23, 2018 reported T score of -3.7 which is significantly worse than previous.  Patient also reports that Fosamax is a financial burden.  Proceed with Prolia today.  Continue tamoxifen as above.  Patient has also been instructed to continue calcium and vitamin D  supplementation.  Return to clinic in 6 months as above.    I spent a total of 30 minutes face-to-face with the patient of which greater than 50% of the visit was spent in counseling and coordination of care as detailed above.   Patient expressed understanding and was in agreement with this plan. She also understands that She can call clinic at any time with any questions, concerns, or complaints.   Cancer Staging Primary malignant neoplasm of upper inner quadrant of right female breast [facility]) Staging form: Breast, AJCC 7th Edition - Clinical stage from 06/02/2016: Stage IIA (T2, N0, M0) - Signed by Lloyd Huger, MD on 06/02/2016   Lloyd Huger, MD   01/24/2019 10:12 AM

## 2019-01-24 ENCOUNTER — Inpatient Hospital Stay: Payer: Medicare Other

## 2019-01-24 ENCOUNTER — Other Ambulatory Visit: Payer: Self-pay

## 2019-01-24 ENCOUNTER — Inpatient Hospital Stay: Payer: Medicare Other | Attending: Oncology

## 2019-01-24 ENCOUNTER — Inpatient Hospital Stay (HOSPITAL_BASED_OUTPATIENT_CLINIC_OR_DEPARTMENT_OTHER): Payer: Medicare Other | Admitting: Oncology

## 2019-01-24 VITALS — BP 183/74 | HR 78 | Temp 96.8°F | Resp 18 | Wt 119.1 lb

## 2019-01-24 DIAGNOSIS — C50211 Malignant neoplasm of upper-inner quadrant of right female breast: Secondary | ICD-10-CM

## 2019-01-24 DIAGNOSIS — M810 Age-related osteoporosis without current pathological fracture: Secondary | ICD-10-CM | POA: Insufficient documentation

## 2019-01-24 LAB — BASIC METABOLIC PANEL
Anion gap: 8 (ref 5–15)
BUN: 18 mg/dL (ref 8–23)
CALCIUM: 9.7 mg/dL (ref 8.9–10.3)
CO2: 26 mmol/L (ref 22–32)
CREATININE: 0.87 mg/dL (ref 0.44–1.00)
Chloride: 104 mmol/L (ref 98–111)
GFR calc non Af Amer: 56 mL/min — ABNORMAL LOW (ref >60)
Glucose, Bld: 99 mg/dL (ref 70–99)
Potassium: 4.2 mmol/L (ref 3.5–5.1)
SODIUM: 138 mmol/L (ref 135–145)

## 2019-01-24 MED ORDER — ALENDRONATE SODIUM 70 MG PO TABS: 70.0000 mg | ORAL_TABLET | ORAL | 3 refills | Status: DC

## 2019-01-24 MED ORDER — TAMOXIFEN CITRATE 20 MG PO TABS: 20.0000 mg | ORAL_TABLET | Freq: Every day | ORAL | 1 refills | Status: DC

## 2019-01-24 MED ORDER — DENOSUMAB 60 MG/ML ~~LOC~~ SOSY
60.0000 mg | PREFILLED_SYRINGE | Freq: Once | SUBCUTANEOUS | Status: AC
  Administered 2019-01-24: 60 mg via SUBCUTANEOUS
  Filled 2019-01-24: qty 1

## 2019-01-24 NOTE — Progress Notes (Signed)
Patient offers no complaints today. 

## 2019-03-17 ENCOUNTER — Other Ambulatory Visit
Admission: RE | Admit: 2019-03-17 | Discharge: 2019-03-17 | Disposition: A | Discharge status: Home / Self Care | Payer: Medicare Other | Source: Ambulatory Visit | Attending: Cardiology | Admitting: Cardiology

## 2019-03-17 ENCOUNTER — Other Ambulatory Visit: Payer: Self-pay

## 2019-03-17 DIAGNOSIS — Z1159 Encounter for screening for other viral diseases: Secondary | ICD-10-CM | POA: Insufficient documentation

## 2019-03-18 LAB — NOVEL CORONAVIRUS, NAA (HOSP ORDER, SEND-OUT TO REF LAB; TAT 18-24 HRS): SARS-CoV-2, NAA: NOT DETECTED

## 2019-03-20 ENCOUNTER — Encounter
Admission: RE | Admit: 2019-03-20 | Discharge: 2019-03-20 | Disposition: A | Discharge status: Home / Self Care | Payer: Medicare Other | Source: Ambulatory Visit | Attending: Cardiology | Admitting: Cardiology

## 2019-03-20 ENCOUNTER — Other Ambulatory Visit: Payer: Self-pay

## 2019-03-20 HISTORY — DX: Unspecified osteoarthritis, unspecified site: M19.90

## 2019-03-20 NOTE — Patient Instructions (Signed)
Your procedure is scheduled on: 03-22-19 Report to Same Day Surgery 2nd floor medical mall [facility] Entrance-take elevator on left to 2nd floor.  Check in with surgery information desk.) To find out your arrival time please call [phone] between 1PM - 3PM on 03-23-19  Remember: Instructions that are not followed completely may result in serious medical risk, up to and including death, or upon the discretion of your surgeon and anesthesiologist your surgery may need to be rescheduled.    _x___ 1. Do not eat food after midnight the night before your procedure. NO GUM OR CANDY AFTER MIDNIGHT. You may drink clear liquids up to 2 hours before you are scheduled to arrive at the hospital for your procedure.  Do not drink clear liquids within 2 hours of your scheduled arrival to the hospital.  Clear liquids include  --Water or Apple juice without pulp  --Clear carbohydrate beverage such as ClearFast or Gatorade  --Black Coffee or Clear Tea (No milk, no creamers, do not add anything to the coffee or Tea   ____Ensure clear carbohydrate drink on the way to the hospital for bariatric patients  ____Ensure clear carbohydrate drink 3 hours before surgery for Dr Dwyane Luo patients if physician instructed.    __x__ 2. No Alcohol for 24 hours before or after surgery.   __x__3. No Smoking or e-cigarettes for 24 prior to surgery.  Do not use any chewable tobacco products for at least 6 hour prior to surgery   ____  4. Bring all medications with you on the day of surgery if instructed.    __x__ 5. Notify your doctor if there is any change in your medical condition     (cold, fever, infections).    x___6. On the morning of surgery brush your teeth with toothpaste and water.  You may rinse your mouth with mouth wash if you wish.  Do not swallow any toothpaste or mouthwash.   Do not wear jewelry, make-up, hairpins, clips or nail polish.  Do not wear lotions, powders, or perfumes. You may wear  deodorant.  Do not shave 48 hours prior to surgery. Men may shave face and neck.  Do not bring valuables to the hospital.    [facility] is not responsible for any belongings or valuables.               Contacts, dentures or bridgework may not be worn into surgery.  Leave your suitcase in the car. After surgery it may be brought to your room.  For patients admitted to the hospital, discharge time is determined by your treatment team.  _  Patients discharged the day of surgery will not be allowed to drive home.  You will need someone to drive you home and stay with you the night of your procedure.    Please read over the following fact sheets that you were given:   [facility] Preparing for Surgery   ____ Take anti-hypertensive listed below, cardiac, seizure, asthma, anti-reflux and psychiatric medicines. These include:  1. NONE  2.  3.  4.  5.  6.  ____Fleets enema or Magnesium Citrate as directed.   ____ Use CHG Soap or sage wipes as directed on instruction sheet   ____ Use inhalers on the day of surgery and bring to hospital day of surgery  ____ Stop Metformin and Janumet 2 days prior to surgery.    ____ Take 1/2 of usual insulin dose the night before surgery and none on the morning surgery.  _x___ Follow recommendations from Cardiologist, Pulmonologist or PCP regarding stopping Aspirin, Coumadin, Plavix ,Eliquis, Effient, or Pradaxa, and Pletal-CALL DR PARASCHOS OFFICE REGARDING 81 MG ASPIRIN  X____Stop Anti-inflammatories such as Advil, Aleve, Ibuprofen, Motrin, Naproxen, Naprosyn, Goodies powders or aspirin products NOW-OK to take Tylenol    _x___ Stop supplements until after surgery-STOP CO Q-10 AND FISH OIL NOW-MAY RESUME AFTER SURGERY   ____ Bring C-Pap to the hospital.

## 2019-03-21 MED ORDER — CEFAZOLIN SODIUM-DEXTROSE 1-4 GM/50ML-% IV SOLN
1.0000 g | Freq: Once | INTRAVENOUS | Status: AC
  Administered 2019-03-22: 12:00:00 1 g via INTRAVENOUS

## 2019-03-22 ENCOUNTER — Encounter: Payer: Self-pay | Admitting: *Deleted

## 2019-03-22 ENCOUNTER — Encounter
Admission: RE | Disposition: A | Discharge status: Home / Self Care | Payer: Self-pay | Source: Home / Self Care | Attending: Cardiology

## 2019-03-22 ENCOUNTER — Ambulatory Visit: Payer: Medicare Other | Admitting: Registered Nurse

## 2019-03-22 ENCOUNTER — Other Ambulatory Visit: Payer: Self-pay

## 2019-03-22 ENCOUNTER — Ambulatory Visit
Admission: RE | Admit: 2019-03-22 | Discharge: 2019-03-22 | Disposition: A | Discharge status: Home / Self Care | Payer: Medicare Other | Attending: Cardiology | Admitting: Cardiology

## 2019-03-22 DIAGNOSIS — M1990 Unspecified osteoarthritis, unspecified site: Secondary | ICD-10-CM | POA: Diagnosis not present

## 2019-03-22 DIAGNOSIS — I442 Atrioventricular block, complete: Secondary | ICD-10-CM | POA: Insufficient documentation

## 2019-03-22 DIAGNOSIS — F419 Anxiety disorder, unspecified: Secondary | ICD-10-CM | POA: Diagnosis not present

## 2019-03-22 DIAGNOSIS — Z7982 Long term (current) use of aspirin: Secondary | ICD-10-CM | POA: Diagnosis not present

## 2019-03-22 DIAGNOSIS — Z79810 Long term (current) use of selective estrogen receptor modulators (SERMs): Secondary | ICD-10-CM | POA: Insufficient documentation

## 2019-03-22 DIAGNOSIS — M810 Age-related osteoporosis without current pathological fracture: Secondary | ICD-10-CM | POA: Diagnosis not present

## 2019-03-22 DIAGNOSIS — Z87891 Personal history of nicotine dependence: Secondary | ICD-10-CM | POA: Insufficient documentation

## 2019-03-22 DIAGNOSIS — Z45018 Encounter for adjustment and management of other part of cardiac pacemaker: Secondary | ICD-10-CM | POA: Diagnosis not present

## 2019-03-22 DIAGNOSIS — K219 Gastro-esophageal reflux disease without esophagitis: Secondary | ICD-10-CM | POA: Diagnosis not present

## 2019-03-22 DIAGNOSIS — I10 Essential (primary) hypertension: Secondary | ICD-10-CM | POA: Insufficient documentation

## 2019-03-22 DIAGNOSIS — Z79899 Other long term (current) drug therapy: Secondary | ICD-10-CM | POA: Insufficient documentation

## 2019-03-22 HISTORY — PX: PACEMAKER INSERTION: SHX728

## 2019-03-22 LAB — SURGICAL PCR SCREEN
MRSA, PCR: NEGATIVE
Staphylococcus aureus: NEGATIVE

## 2019-03-22 SURGERY — INSERTION, CARDIAC PACEMAKER
Anesthesia: Monitor Anesthesia Care | Laterality: Left

## 2019-03-22 MED ORDER — SODIUM CHLORIDE 0.9 % IV SOLN
INTRAVENOUS | Status: DC | PRN
  Administered 2019-03-22: 13:00:00 350 mL

## 2019-03-22 MED ORDER — FAMOTIDINE 20 MG PO TABS
ORAL_TABLET | ORAL | Status: AC
  Administered 2019-03-22: 20 mg via ORAL
  Filled 2019-03-22: qty 1

## 2019-03-22 MED ORDER — CEFAZOLIN SODIUM-DEXTROSE 1-4 GM/50ML-% IV SOLN
INTRAVENOUS | Status: AC
  Filled 2019-03-22: qty 50

## 2019-03-22 MED ORDER — SODIUM CHLORIDE 0.9 % IV SOLN
Freq: Once | INTRAVENOUS | Status: DC
  Filled 2019-03-22: qty 2

## 2019-03-22 MED ORDER — ONDANSETRON HCL 4 MG/2ML IJ SOLN: 4.0000 mg | Freq: Once | INTRAMUSCULAR | Status: DC | PRN

## 2019-03-22 MED ORDER — CEPHALEXIN 250 MG PO CAPS: 500.0000 mg | ORAL_CAPSULE | Freq: Two times a day (BID) | ORAL | 0 refills | Status: DC

## 2019-03-22 MED ORDER — PROPOFOL 500 MG/50ML IV EMUL
INTRAVENOUS | Status: DC | PRN
  Administered 2019-03-22: 100 ug/kg/min via INTRAVENOUS

## 2019-03-22 MED ORDER — FENTANYL CITRATE (PF) 100 MCG/2ML IJ SOLN: 25.0000 ug | INTRAMUSCULAR | Status: DC | PRN

## 2019-03-22 MED ORDER — ACETAMINOPHEN 325 MG PO TABS: 325.0000 mg | ORAL_TABLET | ORAL | Status: DC | PRN

## 2019-03-22 MED ORDER — LIDOCAINE 1 % OPTIME INJ - NO CHARGE
INTRAMUSCULAR | Status: DC | PRN
  Administered 2019-03-22: 13:00:00 17 mL

## 2019-03-22 MED ORDER — LACTATED RINGERS IV SOLN
INTRAVENOUS | Status: DC
  Administered 2019-03-22 (×2): via INTRAVENOUS

## 2019-03-22 MED ORDER — FAMOTIDINE 20 MG PO TABS
20.0000 mg | ORAL_TABLET | Freq: Once | ORAL | Status: AC
  Administered 2019-03-22: 20 mg via ORAL

## 2019-03-22 MED ORDER — ONDANSETRON HCL 4 MG/2ML IJ SOLN: 4.0000 mg | Freq: Four times a day (QID) | INTRAMUSCULAR | Status: DC | PRN

## 2019-03-22 SURGICAL SUPPLY — 38 items
BAG DECANTER FOR FLEXI CONT (MISCELLANEOUS) ×3 IMPLANT
BRUSH SCRUB EZ  4% CHG (MISCELLANEOUS) ×2
BRUSH SCRUB EZ 4% CHG (MISCELLANEOUS) ×1 IMPLANT
CABLE SURG 12 DISP A/V CHANNEL (MISCELLANEOUS) IMPLANT
CANISTER SUCT 1200ML W/VALVE (MISCELLANEOUS) ×3 IMPLANT
CHLORAPREP W/TINT 26 (MISCELLANEOUS) ×3 IMPLANT
COVER LIGHT HANDLE STERIS (MISCELLANEOUS) ×6 IMPLANT
COVER MAYO STAND STRL (DRAPES) ×3 IMPLANT
COVER WAND RF STERILE (DRAPES) ×3 IMPLANT
DRAPE C-ARM XRAY 36X54 (DRAPES) ×3 IMPLANT
DRSG TEGADERM 4X4.75 (GAUZE/BANDAGES/DRESSINGS) ×3 IMPLANT
DRSG TELFA 4X3 1S NADH ST (GAUZE/BANDAGES/DRESSINGS) ×3 IMPLANT
ELECT REM PT RETURN 9FT ADLT (ELECTROSURGICAL) ×3
ELECTRODE REM PT RTRN 9FT ADLT (ELECTROSURGICAL) ×1 IMPLANT
GLIDEWIRE STIFF .35X180X3 HYDR (WIRE) IMPLANT
GLOVE BIO SURGEON STRL SZ7.5 (GLOVE) ×3 IMPLANT
GLOVE BIO SURGEON STRL SZ8 (GLOVE) ×3 IMPLANT
GOWN STRL REUS W/ TWL LRG LVL3 (GOWN DISPOSABLE) ×1 IMPLANT
GOWN STRL REUS W/ TWL XL LVL3 (GOWN DISPOSABLE) ×1 IMPLANT
GOWN STRL REUS W/TWL LRG LVL3 (GOWN DISPOSABLE) ×2
GOWN STRL REUS W/TWL XL LVL3 (GOWN DISPOSABLE) ×2
IMMOBILIZER SHDR MD LX WHT (SOFTGOODS) IMPLANT
IMMOBILIZER SHDR XL LX WHT (SOFTGOODS) IMPLANT
INTRO PACEMAKR LEAD 9FR 13CM (INTRODUCER)
INTRO PACEMKR SHEATH II 7FR (MISCELLANEOUS)
INTRODUCER PACEMKR LD 9FR 13CM (INTRODUCER) IMPLANT
INTRODUCER PACEMKR SHTH II 7FR (MISCELLANEOUS) IMPLANT
IPG PACE AZUR XT DR MRI W1DR01 (Pacemaker) ×1 IMPLANT
IV NS 500ML (IV SOLUTION) ×2
IV NS 500ML BAXH (IV SOLUTION) ×1 IMPLANT
KIT TURNOVER KIT A (KITS) ×3 IMPLANT
KIT WRENCH (KITS) ×3 IMPLANT
LABEL OR SOLS (LABEL) ×3 IMPLANT
MARKER SKIN DUAL TIP RULER LAB (MISCELLANEOUS) ×3 IMPLANT
PACE AZURE XT DR MRI W1DR01 (Pacemaker) ×3 IMPLANT
PACK PACE INSERTION (MISCELLANEOUS) ×3 IMPLANT
PAD ONESTEP ZOLL R SERIES ADT (MISCELLANEOUS) ×3 IMPLANT
SUT SILK 0 SH 30 (SUTURE) ×9 IMPLANT

## 2019-03-22 NOTE — Interval H&P Note (Signed)
History and Physical Interval Note:  03/22/2019 10:55 AM  April Mccarthy  has presented today for surgery, with the diagnosis of [place].  The various methods of treatment have been discussed with the patient and family. After consideration of risks, benefits and other options for treatment, the patient has consented to  Procedure(s): [place] (Left) as a surgical intervention.  The patient's history has been reviewed, patient examined, no change in status, stable for surgery.  I have reviewed the patient's chart and labs.  Questions were answered to the patient's satisfaction.     Nasira Janusz [employer]

## 2019-03-22 NOTE — Transfer of Care (Signed)
Immediate Anesthesia Transfer of Care Note  Patient: April Mccarthy  Procedure(s) Performed: DUAL CHANGE OUT PACEMAKER (Left )  Patient Location: PACU  Anesthesia Type:General  Level of Consciousness: awake, alert  and oriented  Airway & Oxygen Therapy: Patient Spontanous Breathing  Post-op Assessment: Report given to RN and Post -op Vital signs reviewed and stable  Post vital signs: Reviewed and stable  Last Vitals:  Vitals Value Taken Time  BP 179/60 03/22/2019  1:17 PM  Temp 36.1 C 03/22/2019  1:15 PM  Pulse 62 03/22/2019  1:17 PM  Resp 21 03/22/2019  1:17 PM  SpO2 100 % 03/22/2019  1:17 PM  Vitals shown include unvalidated device data.  Last Pain:  Vitals:   03/22/19 1315  TempSrc:   PainSc: 0-No pain         Complications: No apparent anesthesia complications

## 2019-03-22 NOTE — Anesthesia Post-op Follow-up Note (Signed)
Anesthesia QCDR form completed.        

## 2019-03-22 NOTE — Op Note (Signed)
[facility]   03/22/2019                     1:13 PM  PATIENT:  April Mccarthy    PRE-OPERATIVE DIAGNOSIS:  COMPLETE HEART BLOCK  POST-OPERATIVE DIAGNOSIS:  Same  PROCEDURE:  DUAL CHANGE OUT PACEMAKER  SURGEON:  Isaias Cowman, MD    ANESTHESIA:     PREOPERATIVE INDICATIONS:  [name] is a  85 y.o. female with a diagnosis of [place] who failed conservative measures and elected for surgical management.    The risks benefits and alternatives were discussed with the patient preoperatively including but not limited to the risks of infection, bleeding, cardiopulmonary complications, the need for revision surgery, among others, and the patient was willing to proceed.   OPERATIVE PROCEDURE: The patient was brought to the OR in a fasting state.  The left pectoral region was prepped and draped in usual sterile manner.  Anesthesia was obtained 1% lidocaine locally.  A 6 cm incision was performed over the left pectoral region.  The old pacemaker generator was retrieved electrocautery and blunt dissection.  Leads were disconnected and connected to a new MRI compatible dual-chamber rate responsive pacemaker generator ( Medtronic [id number] ).  The pacemaker pocket was irrigated gentamicin solution.  The new pacemaker generator was positioned into the pocket and the pocket was closed with 2-0 and 4-0 Vicryl, respectively.  Steri-Strips or pressure dressing were applied.  Postprocedural interrogation revealed appropriate dual-chamber atrial and ventricular sensing and pacing thresholds.  There were no periprocedural complications.

## 2019-03-22 NOTE — Anesthesia Preprocedure Evaluation (Signed)
Anesthesia Evaluation  Patient identified by MRN, date of birth, ID band Patient awake    Reviewed: Allergy & Precautions, H&P , NPO status , Patient's Chart, lab work & pertinent test results, reviewed documented beta blocker date and time   Airway Mallampati: II  TM Distance: >3 FB Neck ROM: full    Dental no notable dental hx. (+) Teeth Intact   Pulmonary neg pulmonary ROS, former smoker,    Pulmonary exam normal breath sounds clear to auscultation       Cardiovascular Exercise Tolerance: Poor hypertension, On Medications + dysrhythmias + pacemaker  Rhythm:regular Rate:Normal     Neuro/Psych Anxiety negative neurological ROS  negative psych ROS   GI/Hepatic negative GI ROS, Neg liver ROS,   Endo/Other  negative endocrine ROSdiabetes  Renal/GU      Musculoskeletal   Abdominal   Peds  Hematology negative hematology ROS (+)   Anesthesia Other Findings   Reproductive/Obstetrics negative OB ROS                             Anesthesia Physical Anesthesia Plan  ASA: II  Anesthesia Plan: MAC   Post-op Pain Management:    Induction:   PONV Risk Score and Plan:   Airway Management Planned:   Additional Equipment:   Intra-op Plan:   Post-operative Plan:   Informed Consent: I have reviewed the patients History and Physical, chart, labs and discussed the procedure including the risks, benefits and alternatives for the proposed anesthesia with the patient or authorized representative who has indicated his/her understanding and acceptance.       Plan Discussed with: CRNA  Anesthesia Plan Comments:         Anesthesia Quick Evaluation

## 2019-03-22 NOTE — H&P (Signed)
Jump to Section ? Document InformationEncounter DetailsGoalsImaging ResultsLab ResultsLast Filed Vital SignsPatient ContactsPatient DemographicsPlan of TreatmentProceduresProgress NotesReason for ReferralReason for VisitSocial HistoryVisit Diagnoses April Mccarthy Encounter Summary, generated on May 13, 2020May 13, 2020 Printout Information  Document Contents Document Received Date Document Source Organization  Office Visit May 13, 2020May 13, 2020 [place]   Patient Demographics - 85 y.o. Female; born [birth year]  Patient Address Communication Language Race / Ethnicity Marital Status  [address] [place], [place] [address] [phone] (Home) [place] (Preferred) White / Not Hispanic or Latino Widowed  Reason for Referral  Procedure (Routine) Procedure (Routine)  Status Reason Specialty Diagnoses / Procedures Referred By Contact Referred To Contact  Authorized   Diagnoses  Pre-op testing    Procedures  ECG 12-lead  Sydnee Levans, MD  [place], [place] [address]  Phone: [phone]  Fax: [phone]        Reason for Visit  Reason Comments  6 week follow up ? eri   Encounter Details  Date Type Department Care Team Description  03/16/2019 Office Visit [facility]  [place], [place] [address]  [phone]  Sydnee Levans, [place]  [place], [place] [address]  [phone]  [phone] (Fax)  Pre-op testing (Primary Dx)   Social History - documented as of this encounter Tobacco Use Types Packs/Day Years Used Date  Former Smoker    Quit: 11/09/1988  Smokeless Tobacco: Never Used      Alcohol Use Drinks/Week oz/Week Comments  No      Sex Assigned at [employer] Date Recorded  Not on file    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     COVID-19 Exposure Response Date Recorded  In the last month, have you been in contact with someone who was confirmed or suspected to have Coronavirus / COVID-19? No / Unsure 03/16/2019 9:02 AM EDT   Last Filed Vital Signs - documented in this encounter Vital Sign Reading Time Taken Comments  Blood Pressure 140/70 03/16/2019 9:19 AM EDT   Pulse 64 03/16/2019 9:19 AM EDT   Temperature - -   Respiratory Rate 16 03/16/2019 9:19 AM EDT   Oxygen Saturation 98% 03/16/2019 9:19 AM EDT   Inhaled Oxygen Concentration - -   Weight 55.8 kg (123 lb) 03/16/2019 9:19 AM EDT   Height 149.9 cm (4' 11") 03/16/2019 9:19 AM EDT   Body Mass Index 24.84 03/16/2019 9:19 AM EDT    Progress Notes - documented in this encounter Sydnee Levans, MD - 03/16/2019 9:45 AM EDT Formatting of this note might be different from the original.   Chief Complaint: Chief Complaint  Patient presents with  . 6 week follow up  ? eri  Date of Service: 03/16/2019 Date of Birth: [birth year] PCP: Dennison Nancy, MD  History of Present Illness: [name] is a 85 y.o.female patient who presents for a follow-up visit. She has a history of complete heart block as well as hypertension and has a permanent pacemaker in place. Pacemaker was evaluated today and is at [facility] pacing at 65 bpm. She has a dual-chamber Medtronic device in the DDD mode She denies any chest pain or shortness of breath. Pacemaker is functioning normally.  Past Medical and Surgical History  Past Medical History Past Medical History:  Diagnosis Date  . Anxiety  . Arthritis  . Complete heart block (CMS-HCC)  . GERD (gastroesophageal reflux disease)  . Hypertension  . Osteoporosis, post-menopausal   Past Surgical History She has a past surgical history that includes Cataract extraction (2004); Breast excisional biopsy (2778); Colon surgery; insertion epicardial pacemaker thoracotomy; and Insert / replace / remove pacemaker.    Medications and Allergies  Current Medications  Current Outpatient Medications  Medication Sig Dispense Refill  . ALPRAZolam (XANAX) 0.25 MG tablet TAKE 1 TABLET BY MOUTH ONCE DAILY AS NEEDED FOR SLEEP 30 tablet 3  . aspirin 81 MG EC tablet Take 81 mg by mouth once daily.  . cholecalciferol (VITAMIN D3) 2,000 unit capsule Take 2,000 Units by mouth once daily.  Marland Kitchen denosumab (PROLIA) 60 mg/mL inj syringe Inject 60 mg subcutaneously every 6 (six) months  . omega-3 fatty acids-vitamin E (FISH OIL) 1,000 mg Take 2 capsules by mouth once daily.  . tamoxifen (NOLVADEX) 20 MG tablet Take 20 mg by mouth once daily 6  . UBIDECARENONE (COQ-10 ORAL) Take by mouth once daily.  Marland Kitchen VITAMIN B COMPLEX (B COMPLEX ORAL) Take by mouth once daily.   No current facility-administered medications for this visit.   Allergies: Patient has no known allergies.  Social and Family History  Social History reports that she quit smoking about 30 years ago. She has never used smokeless tobacco. She reports that she does not drink alcohol or use drugs.  Family History Family History  Problem Relation Age of Onset  . Tuberculosis Mother  . Breast cancer Sister   Review of Systems  Review of Systems  Constitutional: Negative for chills, diaphoresis, fever, malaise/fatigue and weight loss.  HENT: Negative for congestion, ear discharge, hearing loss and tinnitus.  Eyes: Negative for blurred vision.  Respiratory: Negative for cough, hemoptysis, sputum production, shortness of breath and wheezing.  Cardiovascular: Negative for chest pain, palpitations, orthopnea, claudication, leg swelling and PND.  Gastrointestinal: Negative for abdominal pain, blood in stool, constipation, diarrhea, heartburn, melena, nausea and vomiting.  Genitourinary: Negative for dysuria, frequency, hematuria and urgency.  Musculoskeletal: Negative for back pain, falls, joint pain and myalgias.  Skin: Negative for itching and rash.  Neurological:  Negative for dizziness, tingling, focal weakness, loss of consciousness, weakness and headaches.  Endo/Heme/Allergies: Negative for polydipsia. Does not bruise/bleed easily.  Psychiatric/Behavioral: Negative for depression, memory loss and substance abuse. The patient is not nervous/anxious.    Physical Examination   Vitals: BP 140/70  Pulse 64  Resp 16  Ht 149.9 cm (4' 11")  Wt 55.8 kg (123 lb)  LMP (LMP Unknown)  SpO2 98%  BMI 24.84 kg/m  Ht:149.9 cm (4' 11") Wt:55.8 kg (123 lb) EUM:PNTI surface area is 1.52 meters squared. Body mass index is 24.84 kg/m.  Wt Readings from Last 3 Encounters:  03/16/19 55.8 kg (123 lb)  02/01/19 54.2 kg (119 lb 6.4 oz)  08/02/18 55.4 kg (122 lb 2.2 oz)   BP Readings from Last 3 Encounters:  03/16/19 140/70  02/01/19 142/66  08/02/18 130/64  general: Female in no acute distress  LUNGS Breath Sounds: Normal Percussion: Normal  CARDIOVASCULAR JVP CV wave: no HJR: no Elevation at 90 degrees: None Carotid Pulse: normal pulsation bilaterally Bruit: None Apex: apical impulse normal  Auscultation Rhythm: normal pacemaker rhythm S1: normal S2: normal Clicks: no Rub: no Murmurs: no murmurs  Gallop: None ABDOMEN Liver enlargement: no Pulsatile aorta: no Ascites: no Bruits: no  EXTREMITIES Clubbing: no Edema: trace to 1+ bilateral  pedal edema Pulses: peripheral pulses symmetrical Femoral Bruits: no Amputation: no SKIN Rash: no Cyanosis: no Embolic phemonenon: no Bruising: no NEURO Alert and Oriented to person, place and time: yes Non focal: yes    Assessment and Plan   85 y.o. female with  ICD-10-CM ICD-9-CM  1. Complete heart block-pacemaker is functioning normally. No evidence of failure. No evidence of symptoms. Device is at [facility] pacing at 65 bpm. This occurred on February 11, 2019. We will schedule generator change out. I44.2 426.0  2. Essential hypertension-blood pressure is well controlled. Will closely follow.  Low-sodium diet is recommended. I10 401.9     Return in about 4 weeks (around 04/13/2019).  These notes generated with voice recognition software. I apologize for typographical errors.  Sydnee Levans, MD    Electronically signed by Sydnee Levans, MD at 03/16/2019 10:30 AM EDT   Plan of Treatment - documented as of this encounter Upcoming Encounters Upcoming Encounters  Date Type Specialty Care Team Description  03/27/2019 Telemedicine Internal Medicine Dennison Nancy, MD  [address]  [place], [place] [address]  [phone]  [phone] (Fax)    04/13/2019 Office Visit Cardiology Fath, Aloha Gell, MD  [place], [place] [address]  [phone]  [phone] (Fax)     Pending Results Pending Results  Name Type Priority Associated Diagnoses Date/Time  ECG 12-lead ECG Routine Pre-op testing  03/16/2019 9:45 AM EDT   Goals - documented as of this encounter Goal Patient Goal Type Associated Problems Recent Progress Patient-Stated? Author  Patient [place], [place]  Note:   Per pt, pt eats very healthy and complies with all goals listed.    Procedures - documented in this encounter Procedure Name Priority Date/Time Associated Diagnosis Comments  CBC W/AUTO DIFFERENTIAL (5 PART DIFF) -[facility] Routine 03/16/2019 10:35 AM EDT Pre-op testing  Results for this procedure are in the results section.   BASIC METABOLIC PANEL (BMP) - [facility] Routine 03/16/2019 10:35 AM EDT Pre-op testing  Results for this procedure are in the results section.   ECG 12-LEAD Routine 03/16/2019 9:45 AM EDT Pre-op testing     Lab Results - documented in this encounter Table of Contents for Lab Results  Basic Metabolic Panel (BMP) (62/01/5596 10:35 AM EDT)  CBC w/auto Differential (5 Part) (03/16/2019 10:35 AM EDT)     Basic Metabolic Panel (BMP) (41/63/8453 10:35 AM EDT) Basic  Metabolic Panel (BMP) (64/68/0321 10:35 AM EDT)  Component Value Ref Range Performed At Pathologist Signature  Glucose 90 70 - 110 mg/dL [facility]   Sodium 138 136 - 145 mmol/L [facility]   Potassium 4.8 3.6 - 5.1 mmol/L [facility]   Chloride 102 97 - 109 mmol/L [facility]   Carbon Dioxide (CO2) 30.3 22.0 - 32.0 mmol/L [facility]   Calcium 9.9 8.7 - 10.3 mg/dL [facility]   Urea Nitrogen (BUN) 15 7 - 25 mg/dL [facility]   Creatinine 0.7 0.6 - 1.1 mg/dL [facility]   Glomerular Filtration Rate (eGFR), MDRD Estimate 78 >60 mL/min/1.73sq m [facility]   BUN/Crea Ratio 21.4 (H) 6.0 - 20.0 [facility]   Anion Gap w/K 10.5 6.0 - 16.0 [facility]    Basic Metabolic Panel (BMP) (22/48/2500 10:35  AM EDT)  Specimen  Blood   Basic Metabolic Panel (BMP) (48/27/0786 10:35 AM EDT)  Performing Organization Address City/State/Zipcode Phone Number  [place], [place] [address]    Back to top of Lab Results    CBC w/auto Differential (5 Part) (03/16/2019 10:35 AM EDT) CBC w/auto Differential (5 Part) (03/16/2019 10:35 AM EDT)  Component Value Ref Range Performed At Pathologist Signature  WBC (White Blood Cell Count) 7.9 4.1 - 10.2 10^3/uL [facility]   RBC (Red Blood Cell Count) 4.05 4.04 - 5.48 10^6/uL [facility]   Hemoglobin 13.8 12.0 - 15.0 gm/dL [facility]   Hematocrit 41.7 35.0 - 47.0 % [facility]   MCV (Mean Corpuscular Volume) 103.0 (H) 80.0 - 100.0 fl [facility]   MCH (Mean Corpuscular Hemoglobin) 34.1 (H) 27.0 - 31.2 pg [facility]   MCHC (Mean Corpuscular Hemoglobin Concentration) 33.1 32.0 - 36.0 gm/dL [facility]   Platelet Count 247 150 - 450  10^3/uL [facility]   RDW-CV (Red Cell Distribution Width) 12.9 11.6 - 14.8 % [facility]   MPV (Mean Platelet Volume) 10.4 9.4 - 12.4 fl [facility]   Neutrophils 6.08 1.50 - 7.80 10^3/uL [place] [facility]   Lymphocytes 1.02 1.00 - 3.60 10^3/uL [facility]   Monocytes 0.63 0.00 - 1.50 10^3/uL [facility]   Eosinophils 0.06 0.00 - 0.55 10^3/uL [facility]   Basophils 0.06 0.00 - 0.09 10^3/uL [facility]   Neutrophil % 77.3 (H) 32.0 - 70.0 % [facility]   Lymphocyte % 13.0 10.0 - 50.0 % [facility]   Monocyte % 8.0 4.0 - 13.0 % [facility]   Eosinophil % 0.8 (L) 1.0 - 5.0 % [facility]   Basophil% 0.8 0.0 - 2.0 % [facility]   Immature Granulocyte % 0.1 <=0.7 % [facility]   Immature Granulocyte Count 0.01 <=0.06 10^3/L [place] [facility]    CBC w/auto Differential (5 Part) (03/16/2019 10:35 AM EDT)  Specimen  Blood   CBC w/auto Differential (5 Part) (03/16/2019 10:35 AM EDT)  Performing Organization Address City/State/Zipcode Phone Number  [place]  [place], [place] [address]    Back to top of Lab Results  Imaging Results - documented in this encounter  X-ray chest PA and lateral (03/16/2019 10:07 AM EDT) X-ray chest PA and lateral (03/16/2019 10:07 AM EDT)  Specimen     X-ray chest PA and lateral (03/16/2019 10:07 AM EDT)  Narrative Performed At  This result has an attachment that is not available.        Visit Diagnoses - documented in this encounter Diagnosis  Pre-op testing - Primary  Unspecified pre-operative examination   Images  Patient Contacts  Contact Name Contact Address Communication Relationship to Patient  Sanya Kobrin Unknown [phone] [facility]) Son or Daughter, Emergency Contact   Document Information  Primary Care Provider Other Service Providers Document Coverage Dates  Dennison Nancy, MD (Nov. 12, 2015November 12, 2015 - Present) DM: [phone] (Work) [phone] (Fax) [address] [place], [place] [address]  [place], [place] [address]  May 07, 2020May 07, 2020   [place] [address] [place], [place] [address]   Encounter Providers Encounter Date  Sydnee Levans, MD (Attending) DM: [phone] (Work) [phone] (Fax) [address] [place], [place] [address] Cardiovascular Disease May 07, 2020May 07, 2020    Show All Sections

## 2019-03-22 NOTE — Discharge Instructions (Signed)

## 2019-03-23 ENCOUNTER — Encounter: Payer: Self-pay | Admitting: Cardiology

## 2019-03-24 NOTE — Anesthesia Postprocedure Evaluation (Signed)
Anesthesia Post Note  Patient: April Mccarthy  Procedure(s) Performed: DUAL CHANGE OUT PACEMAKER (Left )  Patient location during evaluation: PACU Anesthesia Type: MAC Level of consciousness: awake and alert Pain management: pain level controlled Vital Signs Assessment: post-procedure vital signs reviewed and stable Respiratory status: spontaneous breathing, nonlabored ventilation, respiratory function stable and patient connected to nasal cannula oxygen Cardiovascular status: blood pressure returned to baseline and stable Postop Assessment: no apparent nausea or vomiting Anesthetic complications: no     Last Vitals:  Vitals:   03/22/19 1400 03/22/19 1417  BP: (!) 217/78 (!) 190/69  Pulse: 74   Resp: 16   Temp:  (!) 36.1 C  SpO2:  98%    Last Pain:  Vitals:   03/22/19 1417  TempSrc: Tympanic  PainSc: 0-No pain                 Molli Barrows

## 2019-06-27 ENCOUNTER — Ambulatory Visit
Admission: RE | Admit: 2019-06-27 | Discharge: 2019-06-27 | Disposition: A | Discharge status: Home / Self Care | Payer: Medicare Other | Source: Ambulatory Visit | Attending: Oncology | Admitting: Oncology

## 2019-06-27 ENCOUNTER — Other Ambulatory Visit: Payer: Self-pay

## 2019-06-27 DIAGNOSIS — C50211 Malignant neoplasm of upper-inner quadrant of right female breast: Secondary | ICD-10-CM

## 2019-06-27 DIAGNOSIS — M810 Age-related osteoporosis without current pathological fracture: Secondary | ICD-10-CM | POA: Diagnosis not present

## 2019-07-14 NOTE — Progress Notes (Deleted)
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   INTERVAL HISTORY: Patient returns to clinic today for routine 70-month follow-up and continuation of Prolia.  She currently feels well and is asymptomatic.  She is tolerating tamoxifen without significant side effects. She has no neurologic complaints. She denies any pain. She has a good appetite and denies weight loss. She has no recent fevers or illnesses. She denies any chest pain or shortness of breath. She has no nausea, vomiting, constipation, or diarrhea. She has no urinary complaints.  Patient feels at her baseline offers no specific complaints today.  REVIEW OF SYSTEMS:   Review of Systems  Constitutional: Negative.  Negative for fever, malaise/fatigue and weight loss.  Respiratory: Negative.  Negative for cough and shortness of breath.   Cardiovascular: Negative.  Negative for chest pain and leg swelling.  Gastrointestinal: Negative.  Negative for abdominal pain.  Genitourinary: Negative.  Negative for dysuria.  Musculoskeletal: Negative.  Negative for back pain.  Skin: Negative.  Negative for rash.  Neurological: Negative.  Negative for dizziness, sensory change, focal weakness, weakness and headaches.  Psychiatric/Behavioral: Negative.  The patient is not nervous/anxious.     As per HPI. Otherwise, a complete review of systems is negative.  PAST MEDICAL HISTORY: Past Medical History:  Diagnosis Date   Arthritis    Breast cancer ([place]) 05/13/2016   INVASIVE MAMMARY CARCINOMA RIGHT breast   Pacemaker     PAST SURGICAL HISTORY: Past Surgical History:  Procedure Laterality Date   BREAST BIOPSY Right    neg   BREAST BIOPSY Right  05/13/2016   INVASIVE MAMMARY CARCINOMA    COLONOSCOPY     PACEMAKER IMPLANT     PACEMAKER INSERTION Left 03/22/2019   Procedure: DUAL CHANGE OUT PACEMAKER;  Surgeon: Isaias Cowman, MD;  Location: [facility] ORS;  Service: Cardiovascular;  Laterality: Left;    FAMILY HISTORY: Family History  Problem Relation Age of Onset   Breast cancer Neg Hx        ADVANCED DIRECTIVES:    HEALTH MAINTENANCE: Social History   Tobacco Use   Smoking status: Former Smoker    Packs/day: 0.25    Years: 50.00    Pack years: 12.50    Types: Cigarettes    Quit date: 03/19/2009    Years since quitting: 10.3   Smokeless tobacco: Never Used  Substance Use Topics   Alcohol use: No    Alcohol/week: 0.0 standard drinks   Drug use: No     Colonoscopy:  PAP:  Bone density:  Lipid panel:  No Known Allergies  Current Outpatient Medications  Medication Sig Dispense Refill   cephALEXin (KEFLEX) 250 MG capsule Take 2 capsules (500 mg total) by mouth 2 (two) times daily. 14 capsule 0   No current facility-administered medications for this visit.     OBJECTIVE: There were no vitals filed for this visit.   There is no height or weight on file to calculate BMI.    ECOG FS:0 - Asymptomatic  General: Well-developed, well-nourished, no acute distress. Eyes: Pink conjunctiva, anicteric sclera. HEENT: Normocephalic, moist mucous membranes. Breast: Patient declined exam today. Lungs: Clear to auscultation bilaterally. Heart: Regular rate and rhythm. No rubs, murmurs, or gallops. Abdomen: Soft, nontender, nondistended. No organomegaly noted,  normoactive bowel sounds. Musculoskeletal: No edema, cyanosis, or clubbing. Neuro: Alert, answering all questions appropriately. Cranial nerves grossly intact. Skin: No rashes or petechiae noted. Psych: Normal affect.  LAB RESULTS:  Lab Results  Component Value Date   NA 138 01/24/2019   K 4.2 01/24/2019   CL 104 01/24/2019   CO2 26 01/24/2019    GLUCOSE 99 01/24/2019   BUN 18 01/24/2019   CREATININE 0.87 01/24/2019   CALCIUM 9.7 01/24/2019   GFRNONAA 56 (L) 01/24/2019   GFRAA >60 01/24/2019    No results found for: WBC, NEUTROABS, HGB, HCT, MCV, PLT   STUDIES: Dg Bone Density  Result Date: 06/27/2019 EXAM: DUAL X-RAY ABSORPTIOMETRY (DXA) FOR BONE MINERAL DENSITY IMPRESSION: Technologist: [place] Old Your patient April Mccarthy completed a BMD test on 06/27/2019 using the [place] (analysis version: 14.10) manufactured by [employer]. The following summarizes the results of our evaluation. PATIENT BIOGRAPHICAL: Name: April Mccarthy Patient ID:  [id number] Birth Date: 27-Aug-1920 Height:     59.0 in. Gender:      Female Exam Date:  06/27/2019 Weight:     120.0 lbs. Indications: Advanced Age, Height Loss, Osteoporotic, Breast CA, High Risk Meds, Caucasian, Postmenopausal Fractures: Treatments: calcium w/ vit D, Multi-Vitamin with calcium, Prolia, Tamoxifen, Vitamin D ASSESSMENT: The BMD measured at Forearm Radius 33% is 0.607 g/cm2 with a T-score of -3.1. This patient is considered OSTEOPOROTIC according to [facility]) criteria. Lumbar spine was not utilized due to advanced degenerative changes. The quality of the scan is good. Site Region Measured Measured WHO Young Adult BMD Date       Age      Classification T-score DualFemur Neck Right 06/27/2019 97.1 Osteopenia -2.1 0.750 g/cm2 DualFemur Neck Right 06/23/2018 96.1 Osteopenia -2.1 0.745 g/cm2 DualFemur Neck Right 06/22/2017 95.1 Osteopenia -2.2 0.727 g/cm2 DualFemur Neck Right 06/17/2016 94.1 Osteopenia -2.3 0.719 g/cm2 DualFemur Total Mean 06/27/2019 97.1 Osteopenia -1.5 0.822 g/cm2 DualFemur Total Mean 06/23/2018 96.1 Osteopenia -1.7 0.787 g/cm2 DualFemur Total Mean 06/22/2017 95.1 Osteopenia -1.6 0.800 g/cm2 DualFemur Total Mean 06/17/2016 94.1 Osteopenia -1.4 0.826 g/cm2 Left Forearm Radius 33% 06/27/2019 97.1 Osteoporosis -3.1 0.607 g/cm2 Left Forearm Radius 33%  06/23/2018 96.1 Osteoporosis -3.7 0.550 g/cm2 Left Forearm Radius 33% 06/22/2017 95.1 Osteoporosis -3.1 0.608 g/cm2 Left Forearm Radius 33% 06/17/2016 94.1 Osteoporosis -2.9 0.618 g/cm2 World Health Organization [facility]) criteria for post-menopausal, Caucasian Women: Normal:       T-score at or above -1 SD Osteopenia:   T-score between -1 and -2.5 SD Osteoporosis: T-score at or below -2.5 SD RECOMMENDATIONS: 1. All patients should optimize calcium and vitamin D intake. 2. Consider FDA-approved medical therapies in postmenopausal women and men aged 58 years and older, based on the following: a. A hip or vertebral(clinical or morphometric) fracture b. T-score < -2.5 at the femoral neck or spine after appropriate evaluation to exclude secondary causes c. Low bone mass (T-score between -1.0 and -2.5 at the femoral neck or spine) and a 10-year probability of a hip fracture > 3% or a 10-year probability of a major osteoporosis-related fracture > 20% based on the [place]-adapted WHO algorithm d. Clinician judgment and/or patient preferences may indicate treatment for people with 10-year fracture probabilities above or below these levels FOLLOW-UP: People with diagnosed cases of osteoporosis or at high risk for fracture should have regular bone mineral density tests. For patients eligible for Medicare, routine testing is allowed once every 2 years. The testing frequency can be increased to one year for patients  who have rapidly progressing disease, those who are receiving or discontinuing medical therapy to restore bone mass, or have additional risk factors. Mccarthy have reviewed this report, and agree with the above findings. Mark A. Thornton Papas, M.D. [facility] Radiology Electronically Signed   By: Lavonia Dana M.D.   On: 06/27/2019 13:31   US Breast Limited Uni Right Inc Axilla  Result Date: 06/27/2019 CLINICAL DATA:  85 year old female for follow-up of known RIGHT breast cancer, on antiestrogen therapy. EXAM: DIGITAL DIAGNOSTIC  BILATERAL MAMMOGRAM WITH CAD AND TOMO ULTRASOUND RIGHT BREAST COMPARISON:  Previous exam(s). ACR Breast Density Category b: There are scattered areas of fibroglandular density. FINDINGS: 2D/3D full field views of both breasts again demonstrate a RIBBON clip within the [place] breast at the site of biopsy-proven malignancy. No discrete or suspicious mass, nonsurgical distortion or worrisome calcifications are noted within either breast. Mammographic images were processed with CAD. Targeted ultrasound is performed, showing a 0.7 x 0.3 x 0.4 cm ill-defined hyperechoic area at the 2 o'clock position of the RIGHT breast 7 cm from the nipple, previously measuring 1.1 x 0.4 x 0.6 cm. IMPRESSION: 1. Decreased size of ill-defined hyperechoic area at the site of patient's known malignancy. No new or enlarging abnormalities. 2. No mammographic evidence of LEFT breast malignancy. RECOMMENDATION: Follow-up as clinically indicated. Mccarthy have discussed the findings and recommendations with the patient. If applicable, a reminder letter will be sent to the patient regarding the next appointment. BI-RADS CATEGORY  6: Known biopsy-proven malignancy. Electronically Signed   By: Margarette Canada M.D.   On: 06/27/2019 14:43   Mm Diag Breast Tomo Bilateral  Result Date: 06/27/2019 CLINICAL DATA:  85 year old female for follow-up of known RIGHT breast cancer, on antiestrogen therapy. EXAM: DIGITAL DIAGNOSTIC BILATERAL MAMMOGRAM WITH CAD AND TOMO ULTRASOUND RIGHT BREAST COMPARISON:  Previous exam(s). ACR Breast Density Category b: There are scattered areas of fibroglandular density. FINDINGS: 2D/3D full field views of both breasts again demonstrate a RIBBON clip within the [place] breast at the site of biopsy-proven malignancy. No discrete or suspicious mass, nonsurgical distortion or worrisome calcifications are noted within either breast. Mammographic images were processed with CAD. Targeted ultrasound is performed,  showing a 0.7 x 0.3 x 0.4 cm ill-defined hyperechoic area at the 2 o'clock position of the RIGHT breast 7 cm from the nipple, previously measuring 1.1 x 0.4 x 0.6 cm. IMPRESSION: 1. Decreased size of ill-defined hyperechoic area at the site of patient's known malignancy. No new or enlarging abnormalities. 2. No mammographic evidence of LEFT breast malignancy. RECOMMENDATION: Follow-up as clinically indicated. Mccarthy have discussed the findings and recommendations with the patient. If applicable, a reminder letter will be sent to the patient regarding the next appointment. BI-RADS CATEGORY  6: Known biopsy-proven malignancy. Electronically Signed   By: Margarette Canada M.D.   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   PLAN:    1.  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 Patient initiated letrozole in July 2017 with near complete resolution of her breast mass.  Because of patient's severe and worsening osteoporosis, letrozole has been discontinued and patient was given a prescription for tamoxifen which she will likely take indefinitely.  Patient's most recent mammogram on June 23, 2018 revealed persistent disease, but essentially unchanged from one year  prior.  Repeat mammogram in August 2020.  Return to clinic in 6 months for routine evaluation and continuation of Prolia. 2. Osteoporosis: Bone mineral density on June 23, 2018 reported T score of -3.7 which is significantly worse than previous.  Patient also reports that Fosamax is a financial burden.  Proceed with Prolia today.  Continue tamoxifen as above.  Patient has also been instructed to continue calcium and vitamin D supplementation.  Return to clinic in 6 months as above.    Mccarthy spent a total of 30  minutes face-to-face with the patient of which greater than 50% of the visit was spent in counseling and coordination of care as detailed above.   Patient expressed understanding and was in agreement with this plan. She also understands that She can call clinic at any time with any questions, concerns, or complaints.   Cancer Staging Primary malignant neoplasm of upper inner quadrant of right female breast [facility]) Staging form: Breast, AJCC 7th Edition - Clinical stage from 06/02/2016: Stage IIA (T2, N0, M0) - Signed by Lloyd Huger, MD on 06/02/2016   Lloyd Huger, MD   07/14/2019 3:55 PM

## 2019-07-27 ENCOUNTER — Inpatient Hospital Stay: Payer: Medicare Other

## 2019-07-27 ENCOUNTER — Inpatient Hospital Stay: Payer: Medicare Other | Admitting: Oncology

## 2019-07-27 NOTE — Progress Notes (Signed)
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   INTERVAL HISTORY: Patient returns to clinic today for routine 2-month evaluation and continuation of Prolia.  She continues to feel well and remains asymptomatic.  She continues to tolerate tamoxifen without significant side effects. She has no neurologic complaints. She denies any pain. She has a good appetite and denies weight loss. She has no recent fevers or illnesses.  She denies any chest pain, shortness of breath, cough, or hemoptysis.  She has no nausea, vomiting, constipation, or diarrhea. She has no urinary complaints.  Patient offers no specific complaints today.  REVIEW OF SYSTEMS:   Review of Systems  Constitutional: Negative.  Negative for fever, malaise/fatigue and weight loss.  Respiratory: Negative.  Negative for cough, hemoptysis and shortness of breath.   Cardiovascular: Negative.  Negative for chest pain and leg swelling.  Gastrointestinal: Negative.  Negative for abdominal pain.  Genitourinary: Negative.  Negative for dysuria.  Musculoskeletal: Negative.  Negative for back pain.  Skin: Negative.  Negative for rash.  Neurological: Negative.  Negative for dizziness, sensory change, focal weakness, weakness and headaches.  Psychiatric/Behavioral: Negative.  The patient is not nervous/anxious.     As per HPI. Otherwise, a complete review of systems is negative.  PAST MEDICAL HISTORY: Past Medical History:  Diagnosis Date  . Arthritis   . Breast cancer ([place]) 05/13/2016   INVASIVE MAMMARY CARCINOMA RIGHT breast  . Pacemaker     PAST SURGICAL HISTORY: Past Surgical History:  Procedure Laterality Date  . BREAST BIOPSY Right    neg   . BREAST BIOPSY Right 05/13/2016   INVASIVE MAMMARY CARCINOMA   . COLONOSCOPY    . PACEMAKER IMPLANT    . PACEMAKER INSERTION Left 03/22/2019   Procedure: DUAL CHANGE OUT PACEMAKER;  Surgeon: Isaias Cowman, MD;  Location: [facility] ORS;  Service: Cardiovascular;  Laterality: Left;    FAMILY HISTORY: Family History  Problem Relation Age of Onset  . Breast cancer Neg Hx        ADVANCED DIRECTIVES:    HEALTH MAINTENANCE: Social History   Tobacco Use  . Smoking status: Former Smoker    Packs/day: 0.25    Years: 50.00    Pack years: 12.50    Types: Cigarettes    Quit date: 03/19/2009    Years since quitting: 10.3  . Smokeless tobacco: Never Used  Substance Use Topics  . Alcohol use: No    Alcohol/week: 0.0 standard drinks  . Drug use: No     Colonoscopy:  PAP:  Bone density:  Lipid panel:  Allergies  Allergen Reactions  . Cephalexin Rash    Current Outpatient Medications  Medication Sig Dispense Refill  . ALPRAZolam (XANAX) 0.25 MG tablet TAKE 1 TABLET BY MOUTH ONCE DAILY AS NEEDED FOR SLEEP    . Cholecalciferol (D2000 ULTRA STRENGTH) 50 MCG (2000 UT) CAPS Take by mouth.    . tamoxifen (NOLVADEX) 20 MG tablet Take 20 mg by mouth daily.     No current facility-administered medications for this visit.     OBJECTIVE: Vitals:   08/04/19 1035  BP: (!) 181/140  Pulse: 86  Temp: 98.1 F (36.7 C)     Body mass index is 24.84 kg/m.    ECOG FS:0 -  Asymptomatic  General: Well-developed, well-nourished, no acute distress. Eyes: Pink conjunctiva, anicteric sclera. HEENT: Normocephalic, moist mucous membranes. Lungs: Clear to auscultation bilaterally. Heart: Regular rate and rhythm. No rubs, murmurs, or gallops. Abdomen: Soft, nontender, nondistended. No organomegaly noted, normoactive bowel sounds. Musculoskeletal: No edema, cyanosis, or clubbing. Neuro: Alert, answering all questions appropriately. Cranial nerves grossly intact. Skin: No rashes or petechiae  noted. Psych: Normal affect.  LAB RESULTS:  Lab Results  Component Value Date   NA 136 08/04/2019   K 4.2 08/04/2019   CL 104 08/04/2019   CO2 26 08/04/2019   GLUCOSE 104 (H) 08/04/2019   BUN 17 08/04/2019   CREATININE 0.73 08/04/2019   CALCIUM 9.0 08/04/2019   GFRNONAA >60 08/04/2019   GFRAA >60 08/04/2019    No results found for: WBC, NEUTROABS, HGB, HCT, MCV, PLT   STUDIES: No results found.  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   PLAN:    1.  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 Patient initiated letrozole in July 2017 with near complete resolution of her breast mass.  Because of patient's severe and worsening osteoporosis, letrozole was discontinued and transitioned to tamoxifen which she will likely take indefinitely.  Patient's most recent mammogram on June 27, 2019 revealed continued regression of patient's known malignancy in her right breast.  Repeat mammogram in August 2021.  Return to clinic in 6 months for routine evaluation.   2. Osteoporosis: Patient's most recent bone mineral density reported a T score of -3.1 which is significantly improved over 1 year prior where it was reported at -3.7.  Proceed with Prolia today.  Continue tamoxifen as above as well as calcium and vitamin D supplementation.  Return to clinic in 6 months for further evaluation and continuation of treatment. 3.  Hypertension: Patient's blood pressure is significantly elevated today.  Proceed with Prolia today. Continue monitoring and treatment per primary care.  Patient expressed understanding and was in agreement with this plan. She also understands that She can call clinic at any time with any questions, concerns, or complaints.   Cancer Staging Primary  malignant neoplasm of upper inner quadrant of right female breast [facility]) Staging form: Breast, AJCC 7th Edition - Clinical stage from 06/02/2016: Stage IIA (T2, N0, M0) - Signed by Lloyd Huger, MD on 06/02/2016   Lloyd Huger, MD   08/05/2019 8:10 AM

## 2019-08-03 NOTE — Progress Notes (Signed)
Called patient for tomorrows follow up in the clinic.  Patient denies any nausea, vomiting, diarrhea, constipation, pain or any new concerns with breast

## 2019-08-04 ENCOUNTER — Other Ambulatory Visit: Payer: Self-pay

## 2019-08-04 ENCOUNTER — Encounter: Payer: Self-pay | Admitting: Oncology

## 2019-08-04 ENCOUNTER — Inpatient Hospital Stay (HOSPITAL_BASED_OUTPATIENT_CLINIC_OR_DEPARTMENT_OTHER): Payer: Medicare Other | Admitting: Oncology

## 2019-08-04 ENCOUNTER — Inpatient Hospital Stay: Payer: Medicare Other

## 2019-08-04 ENCOUNTER — Inpatient Hospital Stay: Payer: Medicare Other | Attending: Oncology

## 2019-08-04 VITALS — BP 181/140 | HR 86 | Temp 98.1°F | Wt 123.0 lb

## 2019-08-04 DIAGNOSIS — Z87891 Personal history of nicotine dependence: Secondary | ICD-10-CM | POA: Diagnosis not present

## 2019-08-04 DIAGNOSIS — C50211 Malignant neoplasm of upper-inner quadrant of right female breast: Secondary | ICD-10-CM

## 2019-08-04 DIAGNOSIS — Z79811 Long term (current) use of aromatase inhibitors: Secondary | ICD-10-CM | POA: Insufficient documentation

## 2019-08-04 DIAGNOSIS — Z79899 Other long term (current) drug therapy: Secondary | ICD-10-CM | POA: Insufficient documentation

## 2019-08-04 DIAGNOSIS — M810 Age-related osteoporosis without current pathological fracture: Secondary | ICD-10-CM

## 2019-08-04 DIAGNOSIS — I10 Essential (primary) hypertension: Secondary | ICD-10-CM | POA: Diagnosis not present

## 2019-08-04 DIAGNOSIS — M1990 Unspecified osteoarthritis, unspecified site: Secondary | ICD-10-CM | POA: Insufficient documentation

## 2019-08-04 DIAGNOSIS — Z170 Estrogen receptor positive status [ER+]: Secondary | ICD-10-CM | POA: Diagnosis not present

## 2019-08-04 LAB — BASIC METABOLIC PANEL
Anion gap: 6 (ref 5–15)
BUN: 17 mg/dL (ref 8–23)
CO2: 26 mmol/L (ref 22–32)
Calcium: 9 mg/dL (ref 8.9–10.3)
Chloride: 104 mmol/L (ref 98–111)
Creatinine, Ser: 0.73 mg/dL (ref 0.44–1.00)
GFR calc Af Amer: >60 mL/min (ref >60)
GFR calc non Af Amer: >60 mL/min (ref >60)
Glucose, Bld: 104 mg/dL — ABNORMAL HIGH (ref 70–99)
Potassium: 4.2 mmol/L (ref 3.5–5.1)
Sodium: 136 mmol/L (ref 135–145)

## 2019-08-04 MED ORDER — DENOSUMAB 60 MG/ML ~~LOC~~ SOSY
60.0000 mg | PREFILLED_SYRINGE | Freq: Once | SUBCUTANEOUS | Status: AC
  Administered 2019-08-04: 11:00:00 60 mg via SUBCUTANEOUS
  Filled 2019-08-04: qty 1

## 2019-08-04 NOTE — Progress Notes (Signed)
Patient stated that she had been doing well with no complaints. 

## 2019-09-26 ENCOUNTER — Other Ambulatory Visit: Payer: Self-pay | Admitting: *Deleted

## 2019-09-26 MED ORDER — TAMOXIFEN CITRATE 20 MG PO TABS: 20.0000 mg | ORAL_TABLET | Freq: Every day | ORAL | 1 refills | Status: DC

## 2020-01-15 ENCOUNTER — Ambulatory Visit (INDEPENDENT_AMBULATORY_CARE_PROVIDER_SITE_OTHER): Payer: Medicare Other | Admitting: Podiatry

## 2020-01-15 ENCOUNTER — Encounter: Payer: Self-pay | Admitting: Podiatry

## 2020-01-15 ENCOUNTER — Other Ambulatory Visit: Payer: Self-pay

## 2020-01-15 DIAGNOSIS — L603 Nail dystrophy: Secondary | ICD-10-CM | POA: Diagnosis not present

## 2020-01-19 NOTE — Progress Notes (Signed)
This patient presents  to the office for evaluation and treatment of long thick painful big  nails .  This patient is unable to trim his own nails since the patient cannot reach the feet.  Patient says the nails are painful walking and wearing his shoes. She returns for preventive foot care services.  She presents to the office with her son.  General Appearance  Alert, conversant and in no acute stress.  Vascular  Dorsalis pedis and posterior tibial  pulses are palpable  bilaterally.  Capillary return is within normal limits  bilaterally. Temperature is within normal limits  bilaterally.  Neurologic  Senn-Weinstein monofilament wire test within normal limits  bilaterally. Muscle power within normal limits bilaterally.  Nails Thick disfigured discolored nails with subungual debris  Hallux nails  B/L. Marland Kitchen No evidence of bacterial infection or drainage bilaterally.  Orthopedic  No limitations of motion  feet .  No crepitus or effusions noted.  No bony pathology or digital deformities noted.  Skin  normotropic skin with no porokeratosis noted bilaterally.  No signs of infections or ulcers noted.     Onychomycosis Hallux nails  B/L  IE  Debridement of nails with nail nipper followed by usage of dremel tool.  RTC prn   Gardiner Barefoot DPM

## 2020-02-02 ENCOUNTER — Encounter: Payer: Self-pay | Admitting: Oncology

## 2020-02-02 ENCOUNTER — Other Ambulatory Visit: Payer: Self-pay | Admitting: Emergency Medicine

## 2020-02-02 DIAGNOSIS — C50211 Malignant neoplasm of upper-inner quadrant of right female breast: Secondary | ICD-10-CM

## 2020-02-03 NOTE — Progress Notes (Signed)
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   INTERVAL HISTORY: Patient returns to clinic today for routine 53-month evaluation and continuation of Prolia.  She has had several falls in the interim, but otherwise has felt well.  She continues to tolerate tamoxifen without significant side effects. She has no neurologic complaints. She denies any pain. She has a good appetite and denies weight loss. She has no recent fevers or illnesses.  She denies any chest pain, shortness of breath, cough, or hemoptysis.  She has no nausea, vomiting, constipation, or diarrhea. She has no urinary complaints.  Patient offers no specific complaints today.  REVIEW OF SYSTEMS:   Review of Systems  Constitutional: Negative.  Negative for fever, malaise/fatigue and weight loss.  Respiratory: Negative.  Negative for cough, hemoptysis and shortness of breath.   Cardiovascular: Negative.  Negative for chest pain and leg swelling.  Gastrointestinal: Negative.  Negative for abdominal pain.  Genitourinary: Negative.  Negative for dysuria.  Musculoskeletal: Positive for falls. Negative for back pain.  Skin: Negative.  Negative for rash.  Neurological: Negative.  Negative for dizziness, sensory change, focal weakness, weakness and headaches.  Psychiatric/Behavioral: Negative.  The patient is not nervous/anxious.     As per HPI. Otherwise, a complete review of systems is negative.  PAST MEDICAL HISTORY: Past Medical History:  Diagnosis Date  . Arthritis   . Breast cancer ([place]) 05/13/2016   INVASIVE MAMMARY CARCINOMA RIGHT breast  . Pacemaker     PAST SURGICAL HISTORY: Past Surgical History:  Procedure Laterality Date  .  BREAST BIOPSY Right    neg  . BREAST BIOPSY Right 05/13/2016   INVASIVE MAMMARY CARCINOMA   . COLONOSCOPY    . PACEMAKER IMPLANT    . PACEMAKER INSERTION Left 03/22/2019   Procedure: DUAL CHANGE OUT PACEMAKER;  Surgeon: Isaias Cowman, MD;  Location: [facility] ORS;  Service: Cardiovascular;  Laterality: Left;    FAMILY HISTORY: Family History  Problem Relation Age of Onset  . Breast cancer Neg Hx        ADVANCED DIRECTIVES:    HEALTH MAINTENANCE: Social History   Tobacco Use  . Smoking status: Former Smoker    Packs/day: 0.25    Years: 50.00    Pack years: 12.50    Types: Cigarettes    Quit date: 03/19/2009    Years since quitting: 10.8  . Smokeless tobacco: Never Used  Substance Use Topics  . Alcohol use: No    Alcohol/week: 0.0 standard drinks  . Drug use: No     Colonoscopy:  PAP:  Bone density:  Lipid panel:  Allergies  Allergen Reactions  . Cephalexin Rash    Current Outpatient Medications  Medication Sig Dispense Refill  . ALPRAZolam (XANAX) 0.25 MG tablet TAKE 1 TABLET BY MOUTH ONCE DAILY AS NEEDED FOR SLEEP    . aspirin EC 81 MG tablet Take 81 mg by mouth daily.    Marland Kitchen b complex vitamins capsule Take 1 capsule by mouth daily.    . calcium carbonate (OSCAL) 1500 (600 Ca) MG TABS tablet Take by mouth 2 (two) times daily with a meal.    . Cholecalciferol (D2000 ULTRA STRENGTH) 50 MCG (2000 UT) CAPS Take by mouth.    . Coenzyme Q10 (  COQ10 PO) Take by mouth.    . denosumab (PROLIA) 60 MG/ML SOSY injection Inject 60 mg into the skin every 6 (six) months.    . Omega-3 Fatty Acids (FISH OIL) 1200 MG CAPS Take by mouth.    . tamoxifen (NOLVADEX) 20 MG tablet Take 1 tablet (20 mg total) by mouth daily. 90 tablet 1   No current facility-administered medications for this visit.    OBJECTIVE: Vitals:   02/05/20 1006  BP: (!) 171/59  Pulse: 64  Resp: 20  Temp: (!) 97.5 F (36.4 C)  SpO2: 99%     Body mass index is 25 kg/m.    ECOG FS:0 -  Asymptomatic  General: Thin, no acute distress.  Sitting in a wheelchair. Eyes: Pink conjunctiva, anicteric sclera. HEENT: Normocephalic, moist mucous membranes. Lungs: No audible wheezing or coughing. Heart: Regular rate and rhythm. Abdomen: Soft, nontender, no obvious distention. Musculoskeletal: No edema, cyanosis, or clubbing. Neuro: Alert, answering all questions appropriately. Cranial nerves grossly intact. Skin: No rashes or petechiae noted. Psych: Normal affect.   LAB RESULTS:  Lab Results  Component Value Date   NA 136 02/05/2020   K 4.0 02/05/2020   CL 103 02/05/2020   CO2 26 02/05/2020   GLUCOSE 96 02/05/2020   BUN 18 02/05/2020   CREATININE 0.85 02/05/2020   CALCIUM 8.8 (L) 02/05/2020   GFRNONAA 57 (L) 02/05/2020   GFRAA >60 02/05/2020    No results found for: WBC, NEUTROABS, HGB, HCT, MCV, PLT   STUDIES: No results found.  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   PLAN:    1.  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 Patient initiated letrozole in July 2017 with near complete resolution of her breast mass.  Because of patient's severe and worsening osteoporosis, letrozole was discontinued and transitioned to tamoxifen which she will likely take indefinitely.  Patient's most recent mammogram on June 27, 2019 revealed continued regression of patient's known malignancy in her right breast.  Repeat mammogram in August 2021.  Return to clinic in 6 months for routine evaluation.   2. Osteoporosis: Patient's most recent bone mineral density on June 27, 2019 reported a T score of -3.1 which is significantly improved over 1 year prior where it was reported at -3.7.  Proceed with Prolia today.  Repeat bone mineral density in August 2021.   Return to clinic in 69-month as above for continuation of treatment. 3.  Hypertension: Chronic and unchanged.  Continue monitoring and treatment per primary care.  Patient expressed understanding and was in agreement with this plan. She also understands that She can call clinic at any time with any questions, concerns, or complaints.   Cancer Staging Primary malignant neoplasm of upper inner quadrant of right female breast [facility]) Staging form: Breast, AJCC 7th Edition - Clinical stage from 06/02/2016: Stage IIA (T2, N0, M0) - Signed by Lloyd Huger, MD on 06/02/2016   Lloyd Huger, MD   02/05/2020 12:53 PM

## 2020-02-05 ENCOUNTER — Encounter: Payer: Self-pay | Admitting: Oncology

## 2020-02-05 ENCOUNTER — Other Ambulatory Visit: Payer: Self-pay

## 2020-02-05 ENCOUNTER — Inpatient Hospital Stay (HOSPITAL_BASED_OUTPATIENT_CLINIC_OR_DEPARTMENT_OTHER): Payer: Medicare Other | Admitting: Oncology

## 2020-02-05 ENCOUNTER — Inpatient Hospital Stay: Payer: Medicare Other

## 2020-02-05 ENCOUNTER — Inpatient Hospital Stay: Payer: Medicare Other | Attending: Oncology

## 2020-02-05 VITALS — BP 171/59 | HR 64 | Temp 97.5°F | Resp 20 | Wt 123.8 lb

## 2020-02-05 DIAGNOSIS — C50211 Malignant neoplasm of upper-inner quadrant of right female breast: Secondary | ICD-10-CM | POA: Diagnosis present

## 2020-02-05 DIAGNOSIS — Z881 Allergy status to other antibiotic agents status: Secondary | ICD-10-CM | POA: Diagnosis not present

## 2020-02-05 DIAGNOSIS — Z170 Estrogen receptor positive status [ER+]: Secondary | ICD-10-CM | POA: Insufficient documentation

## 2020-02-05 DIAGNOSIS — Z79899 Other long term (current) drug therapy: Secondary | ICD-10-CM | POA: Diagnosis not present

## 2020-02-05 DIAGNOSIS — Z79811 Long term (current) use of aromatase inhibitors: Secondary | ICD-10-CM | POA: Diagnosis not present

## 2020-02-05 DIAGNOSIS — I10 Essential (primary) hypertension: Secondary | ICD-10-CM | POA: Diagnosis not present

## 2020-02-05 DIAGNOSIS — Z87891 Personal history of nicotine dependence: Secondary | ICD-10-CM | POA: Insufficient documentation

## 2020-02-05 DIAGNOSIS — M810 Age-related osteoporosis without current pathological fracture: Secondary | ICD-10-CM

## 2020-02-05 DIAGNOSIS — M1990 Unspecified osteoarthritis, unspecified site: Secondary | ICD-10-CM | POA: Insufficient documentation

## 2020-02-05 LAB — BASIC METABOLIC PANEL
Anion gap: 7 (ref 5–15)
BUN: 18 mg/dL (ref 8–23)
CO2: 26 mmol/L (ref 22–32)
Calcium: 8.8 mg/dL — ABNORMAL LOW (ref 8.9–10.3)
Chloride: 103 mmol/L (ref 98–111)
Creatinine, Ser: 0.85 mg/dL (ref 0.44–1.00)
GFR calc Af Amer: >60 mL/min (ref >60)
GFR calc non Af Amer: 57 mL/min — ABNORMAL LOW (ref >60)
Glucose, Bld: 96 mg/dL (ref 70–99)
Potassium: 4 mmol/L (ref 3.5–5.1)
Sodium: 136 mmol/L (ref 135–145)

## 2020-02-05 MED ORDER — DENOSUMAB 60 MG/ML ~~LOC~~ SOSY
60.0000 mg | PREFILLED_SYRINGE | Freq: Once | SUBCUTANEOUS | Status: AC
  Administered 2020-02-05: 11:00:00 60 mg via SUBCUTANEOUS
  Filled 2020-02-05: qty 1

## 2020-03-20 ENCOUNTER — Other Ambulatory Visit: Payer: Self-pay | Admitting: Oncology

## 2020-06-27 ENCOUNTER — Other Ambulatory Visit: Payer: Medicare Other

## 2020-07-18 ENCOUNTER — Ambulatory Visit
Admission: RE | Admit: 2020-07-18 | Discharge: 2020-07-18 | Disposition: A | Discharge status: Home / Self Care | Payer: Medicare Other | Source: Ambulatory Visit | Attending: Oncology | Admitting: Oncology

## 2020-07-18 DIAGNOSIS — C50211 Malignant neoplasm of upper-inner quadrant of right female breast: Secondary | ICD-10-CM

## 2020-07-18 DIAGNOSIS — M810 Age-related osteoporosis without current pathological fracture: Secondary | ICD-10-CM | POA: Insufficient documentation

## 2020-08-02 NOTE — Progress Notes (Signed)
  April Mccarthy  [phone] Fax:(336) [phone]  ID: April Mccarthy OB: [birth year]  MR#: [id number]  LMB#:[id number]  Patient Care Team: Juluis Pitch, MD as PCP - General (Family Medicine)    Lloyd Huger, MD   08/09/2020 7:01 AM     This encounter was created in error - please disregard.

## 2020-08-08 ENCOUNTER — Inpatient Hospital Stay: Payer: Medicare Other

## 2020-08-08 ENCOUNTER — Inpatient Hospital Stay: Payer: Medicare Other | Admitting: Oncology

## 2020-09-16 ENCOUNTER — Other Ambulatory Visit: Payer: Self-pay | Admitting: Oncology

## 2021-04-25 IMAGING — MG DIGITAL DIAGNOSTIC BILAT W/ TOMO W/ CAD
8 series · 8 of 24 positions shown · non-contrast
Comparison: Previous exam(s).

CLINICAL DATA: Patient with biopsy proven malignancy at 2 o'clock
in the RIGHT breast. Being managed with antiestrogen therapy. Was
diagnosed in 4719.

EXAM:
DIGITAL DIAGNOSTIC BILATERAL MAMMOGRAM WITH TOMO AND CAD; ULTRASOUND
RIGHT BREAST LIMITED

[R MLO synth-2D]
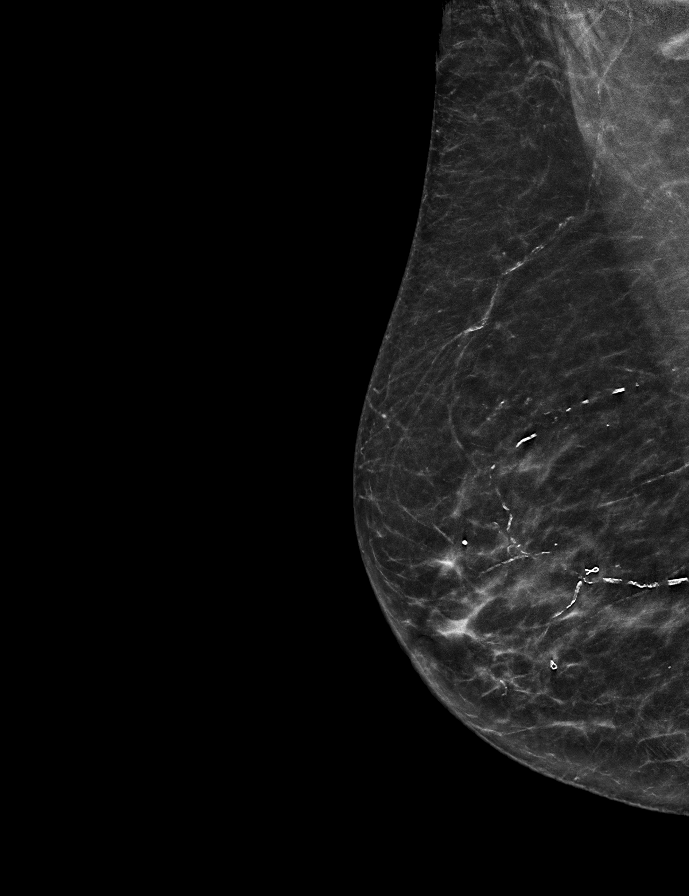

[L CC synth-2D]
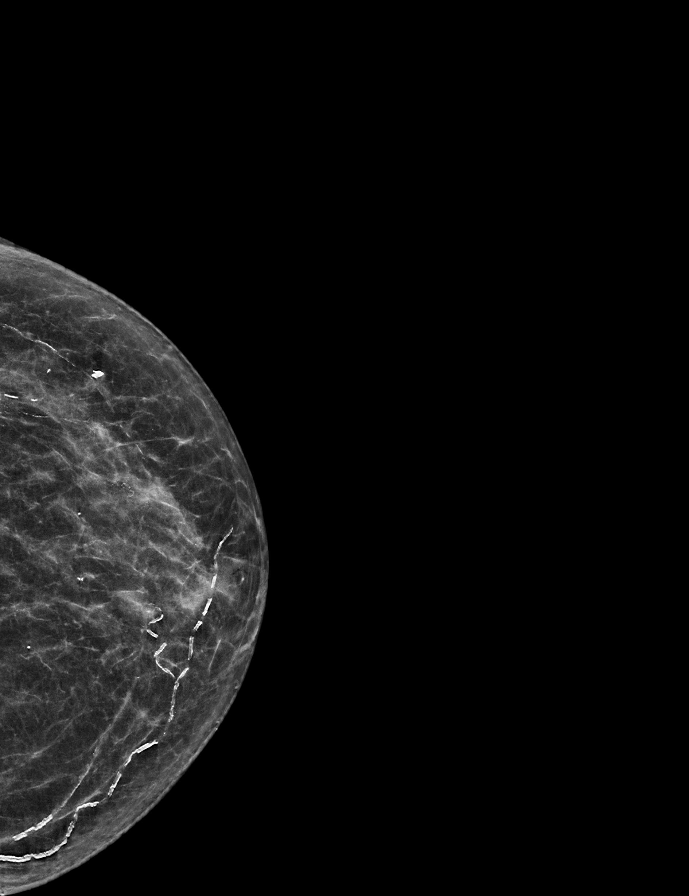

[L MLO synth-2D]
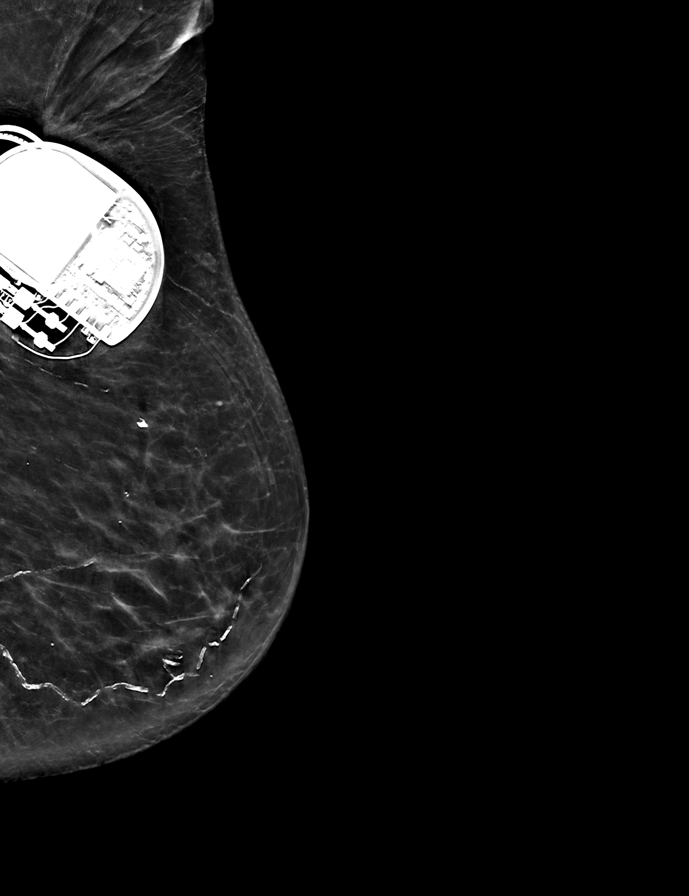

[R CC synth-2D]
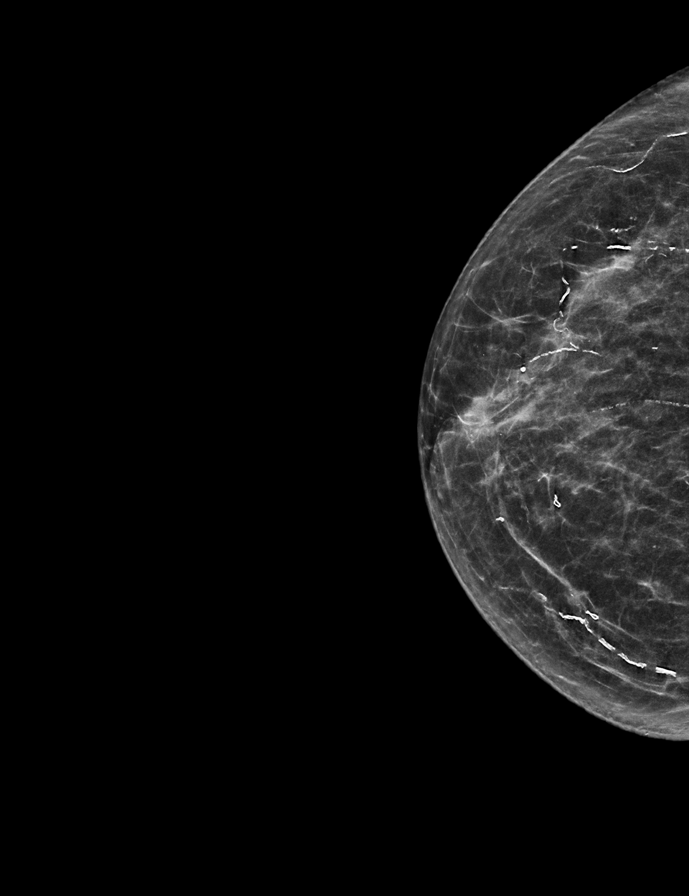

[R MLO tomo · tomo slice 28/55.0]
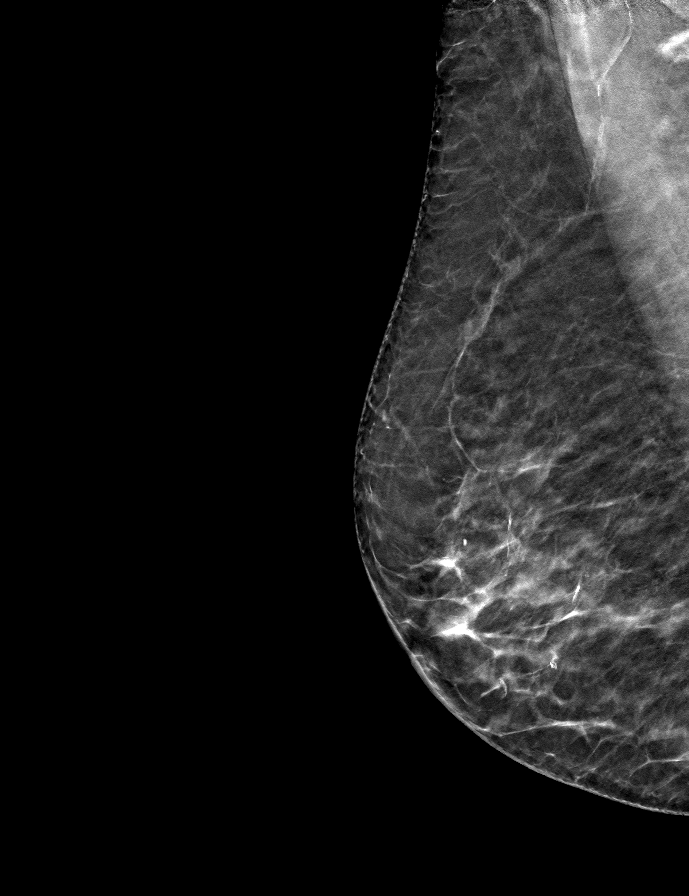

[L MLO tomo · tomo slice 32/63.0]
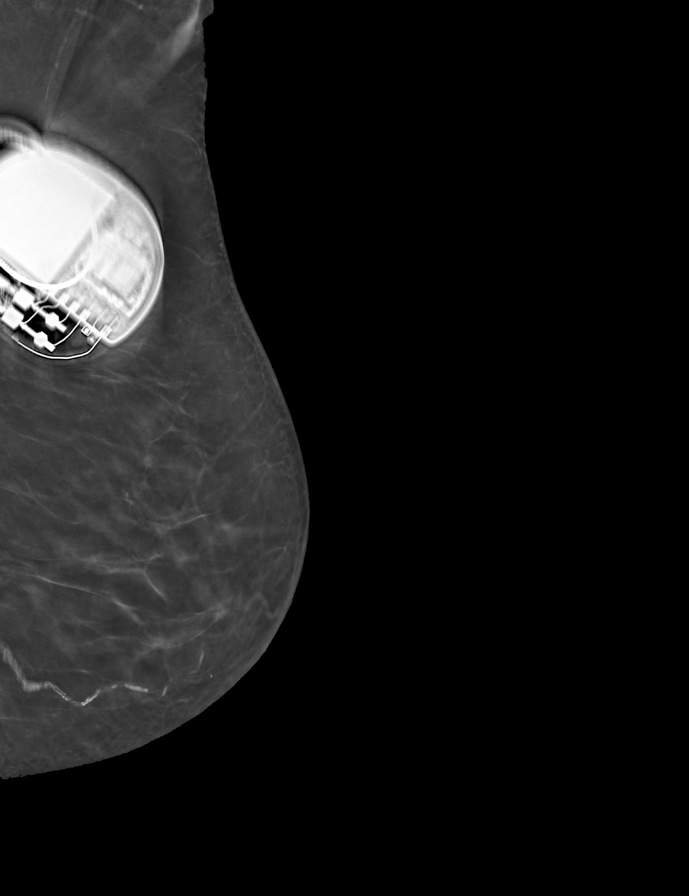

[R CC tomo · tomo slice 27/54.0]
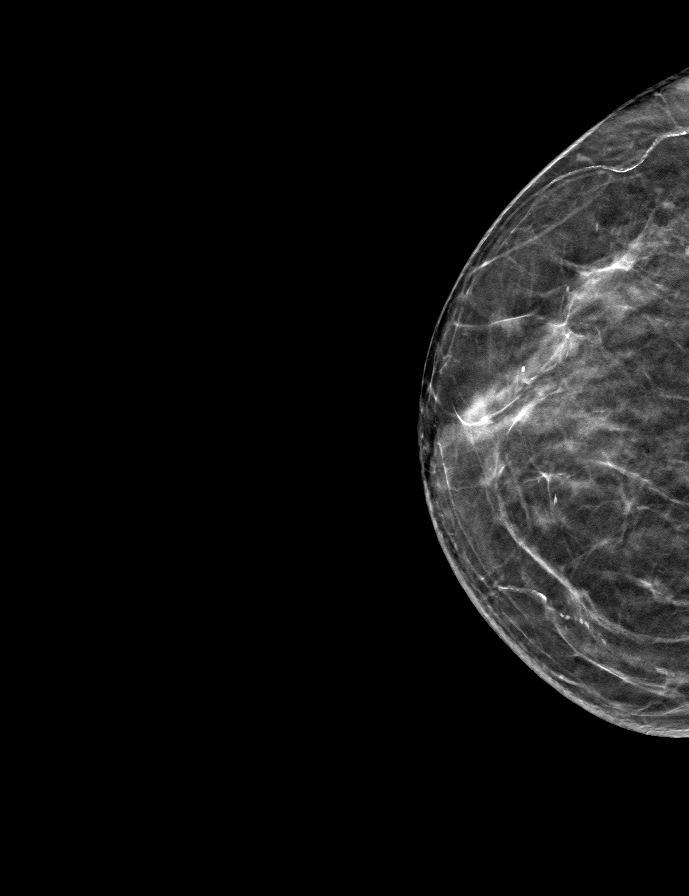

[L CC tomo · tomo slice 27/52.0]
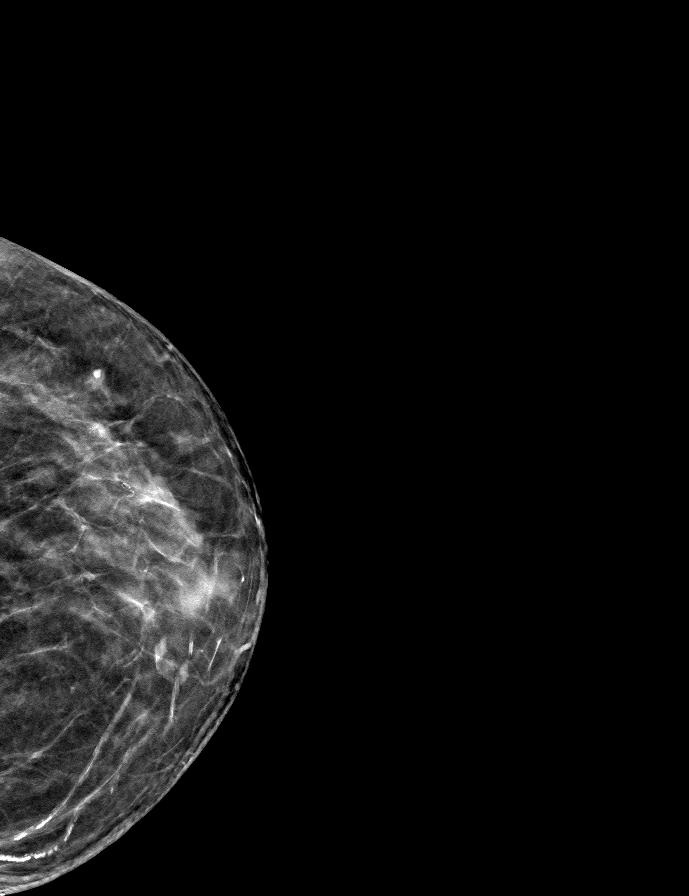

[8 of 24 positions shown; findings below may reference images not displayed]

ACR Breast Density Category b: There are scattered areas of
fibroglandular density.
FINDINGS: There is revisualization of a RIBBON clip within the RIGHT inner
upper breast adjacent to an ill-defined focal asymmetry. This area
measures 4 mm, markedly decreased in size since 4719 and unchanged
in appearance since last year. No new suspicious mass or
calcification are seen bilaterally.

Mammographic images were processed with CAD.

Targeted ultrasound was performed of the RIGHT breast at 2 o'clock 7
cm from the nipple. There is revisualization of an ill-defined
hyperechoic area adjacent to a densely calcified vessel. It is
ill-defined and exact measurements cannot be obtained. It looks at
similar in comparison to prior from last year.
IMPRESSION: 1. Unchanged appearance of ill-defined hyperechoic area at the site
of patient's known malignancy. This is markedly decreased in size
since [DATE]. No new suspicious findings bilaterally.

RECOMMENDATION:
Recommend continued clinical follow-up as clinically indicated.

I have discussed the findings and recommendations with the patient.
If applicable, a reminder letter will be sent to the patient
regarding the next appointment.

BI-RADS CATEGORY  6: Known biopsy-proven malignancy.
